# Patient Record
Sex: Female | Born: 1935
Health system: Southern US, Community
[De-identification: ages and names within clinical notes are randomized; demographics above are authoritative.]

## PROBLEM LIST (undated history)

## (undated) DIAGNOSIS — M1990 Unspecified osteoarthritis, unspecified site: Secondary | ICD-10-CM

## (undated) DIAGNOSIS — T8859XA Other complications of anesthesia, initial encounter: Secondary | ICD-10-CM

## (undated) DIAGNOSIS — M353 Polymyalgia rheumatica: Secondary | ICD-10-CM

## (undated) DIAGNOSIS — E039 Hypothyroidism, unspecified: Secondary | ICD-10-CM

## (undated) DIAGNOSIS — E7439 Other disorders of intestinal carbohydrate absorption: Secondary | ICD-10-CM

## (undated) DIAGNOSIS — E049 Nontoxic goiter, unspecified: Secondary | ICD-10-CM

## (undated) DIAGNOSIS — E119 Type 2 diabetes mellitus without complications: Secondary | ICD-10-CM

## (undated) DIAGNOSIS — F32A Depression, unspecified: Secondary | ICD-10-CM

## (undated) DIAGNOSIS — H269 Unspecified cataract: Secondary | ICD-10-CM

## (undated) DIAGNOSIS — F419 Anxiety disorder, unspecified: Secondary | ICD-10-CM

## (undated) DIAGNOSIS — I10 Essential (primary) hypertension: Secondary | ICD-10-CM

## (undated) DIAGNOSIS — Z9889 Other specified postprocedural states: Secondary | ICD-10-CM

## (undated) DIAGNOSIS — C44310 Basal cell carcinoma of skin of unspecified parts of face: Secondary | ICD-10-CM

## (undated) DIAGNOSIS — F329 Major depressive disorder, single episode, unspecified: Secondary | ICD-10-CM

## (undated) DIAGNOSIS — R112 Nausea with vomiting, unspecified: Secondary | ICD-10-CM

## (undated) DIAGNOSIS — J189 Pneumonia, unspecified organism: Secondary | ICD-10-CM

## (undated) DIAGNOSIS — N183 Chronic kidney disease, stage 3 unspecified: Secondary | ICD-10-CM

## (undated) DIAGNOSIS — M810 Age-related osteoporosis without current pathological fracture: Secondary | ICD-10-CM

## (undated) DIAGNOSIS — T4145XA Adverse effect of unspecified anesthetic, initial encounter: Secondary | ICD-10-CM

## (undated) HISTORY — DX: Other disorders of intestinal carbohydrate absorption: E74.39

## (undated) HISTORY — PX: APPENDECTOMY: SHX54

## (undated) HISTORY — DX: Unspecified osteoarthritis, unspecified site: M19.90

## (undated) HISTORY — DX: Chronic kidney disease, stage 3 unspecified: N18.30

## (undated) HISTORY — DX: Age-related osteoporosis without current pathological fracture: M81.0

## (undated) HISTORY — PX: COLONOSCOPY W/ BIOPSIES AND POLYPECTOMY: SHX1376

## (undated) HISTORY — PX: TONSILLECTOMY: SUR1361

## (undated) HISTORY — PX: ABDOMINAL HYSTERECTOMY: SHX81

---

## 2012-11-25 ENCOUNTER — Other Ambulatory Visit: Payer: Self-pay | Admitting: Sports Medicine

## 2012-11-25 DIAGNOSIS — M5410 Radiculopathy, site unspecified: Secondary | ICD-10-CM

## 2012-12-04 ENCOUNTER — Ambulatory Visit
Admission: RE | Admit: 2012-12-04 | Discharge: 2012-12-04 | Disposition: A | Discharge status: Home / Self Care | Payer: Medicare Other | Source: Ambulatory Visit | Attending: Sports Medicine | Admitting: Sports Medicine

## 2013-08-02 ENCOUNTER — Encounter (HOSPITAL_COMMUNITY): Payer: Self-pay

## 2013-08-02 ENCOUNTER — Other Ambulatory Visit (HOSPITAL_COMMUNITY): Payer: Self-pay | Admitting: Specialist

## 2013-08-04 ENCOUNTER — Encounter (HOSPITAL_COMMUNITY)
Admission: RE | Admit: 2013-08-04 | Discharge: 2013-08-04 | Disposition: A | Discharge status: Home / Self Care | Payer: Medicare Other | Source: Ambulatory Visit | Attending: Specialist | Admitting: Specialist

## 2013-08-04 ENCOUNTER — Encounter (HOSPITAL_COMMUNITY): Payer: Self-pay

## 2013-08-04 DIAGNOSIS — Z01812 Encounter for preprocedural laboratory examination: Secondary | ICD-10-CM | POA: Insufficient documentation

## 2013-08-04 HISTORY — DX: Depression, unspecified: F32.A

## 2013-08-04 HISTORY — DX: Anxiety disorder, unspecified: F41.9

## 2013-08-04 HISTORY — DX: Other specified postprocedural states: Z98.890

## 2013-08-04 HISTORY — DX: Nontoxic goiter, unspecified: E04.9

## 2013-08-04 HISTORY — DX: Type 2 diabetes mellitus without complications: E11.9

## 2013-08-04 HISTORY — DX: Major depressive disorder, single episode, unspecified: F32.9

## 2013-08-04 HISTORY — DX: Pneumonia, unspecified organism: J18.9

## 2013-08-04 HISTORY — DX: Nausea with vomiting, unspecified: R11.2

## 2013-08-04 HISTORY — DX: Adverse effect of unspecified anesthetic, initial encounter: T41.45XA

## 2013-08-04 HISTORY — DX: Other complications of anesthesia, initial encounter: T88.59XA

## 2013-08-04 HISTORY — DX: Essential (primary) hypertension: I10

## 2013-08-04 HISTORY — DX: Unspecified cataract: H26.9

## 2013-08-04 HISTORY — DX: Unspecified osteoarthritis, unspecified site: M19.90

## 2013-08-04 LAB — COMPREHENSIVE METABOLIC PANEL
ALT: 14 U/L (ref 0–35)
Alkaline Phosphatase: 59 U/L (ref 39–117)
BUN: 33 mg/dL — ABNORMAL HIGH (ref 6–23)
CO2: 27 mEq/L (ref 19–32)
Chloride: 101 mEq/L (ref 96–112)
Creatinine, Ser: 1.08 mg/dL (ref 0.50–1.10)
GFR calc Af Amer: 56 mL/min — ABNORMAL LOW (ref >90)
GFR calc non Af Amer: 48 mL/min — ABNORMAL LOW (ref >90)
Glucose, Bld: 89 mg/dL (ref 70–99)
Potassium: 4 mEq/L (ref 3.5–5.1)
Total Bilirubin: 0.2 mg/dL — ABNORMAL LOW (ref 0.3–1.2)

## 2013-08-04 LAB — CBC
HCT: 34.2 % — ABNORMAL LOW (ref 36.0–46.0)
MCV: 96.1 fL (ref 78.0–100.0)
RDW: 12.4 % (ref 11.5–15.5)
WBC: 6.7 10*3/uL (ref 4.0–10.5)

## 2013-08-04 LAB — SURGICAL PCR SCREEN
MRSA, PCR: NEGATIVE
Staphylococcus aureus: NEGATIVE

## 2013-08-04 NOTE — H&P (Addendum)
Karen Wong is an 79 y.o. female.   Chief Complaint: back and left leg pain HPI: Pt with several months of increasing left leg pain,numbness and tingling.  Conservative treatment with steroid injections and analgesics have offered no relief.  Studies indicate left L5-S1 facet cyst compressing the left L5 nerve root. And lateral rcess stenosis left L4-5. Pt wishes to proceed with resection of cyst for decompression.  Past Medical History  Diagnosis Date  . Complication of anesthesia   . PONV (postoperative nausea and vomiting)   . Hypertension   . Diabetes mellitus without complication     Borderline diabetic; no medications  . Goiter     Hx: of  . Cataracts, bilateral     Hx: of "small"  . Anxiety   . Depression   . Pneumonia   . Arthritis     Past Surgical History  Procedure Laterality Date  . Abdominal hysterectomy    . Colonoscopy w/ biopsies and polypectomy      Hx; of  . Appendectomy    . Tonsillectomy      Family History  Problem Relation Age of Onset  . Heart disease Mother   . Hypertension Mother   . Heart disease Father   . Hypertension Father   . Heart disease Brother   . Sarcoidosis Brother    Social History:  reports that she has never smoked. She has never used smokeless tobacco. She reports that she does not drink alcohol or use illicit drugs.  Allergies:  Allergies  Allergen Reactions  . Penicillins Hives  . Asa [Aspirin] Palpitations  . Demerol [Meperidine] Palpitations    Medications Prior to Admission  Medication Sig Dispense Refill  . acetaminophen (TYLENOL) 325 MG tablet Take 325 mg by mouth every 6 (six) hours as needed.       Marland Kitchen amLODipine (NORVASC) 5 MG tablet Take 5 mg by mouth daily.      Marland Kitchen b complex vitamins tablet Take 1 tablet by mouth daily.      . enalapril (VASOTEC) 10 MG tablet Take 10 mg by mouth 2 (two) times daily.      . hydrochlorothiazide (HYDRODIURIL) 25 MG tablet Take 25 mg by mouth daily.      . Levothyroxine Sodium  (TIROSINT) 25 MCG CAPS Take 25 mcg by mouth daily before breakfast.      . sertraline (ZOLOFT) 100 MG tablet Take 100 mg by mouth daily.      . traMADol (ULTRAM) 50 MG tablet Take 50 mg by mouth every 4 (four) hours as needed for moderate pain or severe pain.        Results for orders placed during the hospital encounter of 08/09/13 (from the past 48 hour(s))  GLUCOSE, CAPILLARY     Status: Abnormal   Collection Time    08/09/13  6:15 AM      Result Value Range   Glucose-Capillary 115 (*) 70 - 99 mg/dL   No results found.  Review of Systems  Constitutional: Negative.   HENT: Negative.   Eyes: Negative.   Respiratory: Negative.   Cardiovascular: Negative.   Gastrointestinal: Negative.   Genitourinary: Negative.   Musculoskeletal: Positive for back pain.       Sharp pain in the left leg increasing with sitting and walking.  Hard to get position of comfort.  Skin: Negative.   Neurological: Negative for tingling, sensory change and focal weakness.  Endo/Heme/Allergies: Negative.   Psychiatric/Behavioral: Negative.     Blood pressure 141/49, pulse  68, temperature 97.4 F (36.3 C), temperature source Oral, resp. rate 20, SpO2 97.00%. Physical Exam  Constitutional: She is oriented to person, place, and time. She appears well-developed and well-nourished.  HENT:  Head: Normocephalic and atraumatic.  Eyes: EOM are normal. Pupils are equal, round, and reactive to light.  Neck: Normal range of motion.  Cardiovascular: Normal rate, regular rhythm and normal heart sounds.   Respiratory: Effort normal and breath sounds normal.  GI: Soft.  Musculoskeletal:    She can forward bend fingertips almost to the floor.  Extension with pain.  She has to push to get out of the chair.  She is using her arms primarily.  She has weakness in left foot dorsiflexion; it is 5-/5.  Tender over the left buttock right at the sciatic notch.  Mild discomfort to palpation over the left ischial tuberosity.  Range  of motion of the hips normal.  Straight leg raise negative, both sides.  Popliteal compression significant negative, both sides.  Reflexes at the knee 2+ and symmetric; the ankle, 2+ and symmetric.  She has a slight increase in dorsal kyphosis noted.    Neurological: She is alert and oriented to person, place, and time.  Skin: Skin is warm and dry.  Psychiatric: She has a normal mood and affect.     Assessment/Plan Left L5-S1 facet cyst with stenosis, spinal stenosis Left L4-5   PLAN:  Resection of left L5-S1 cyst and decompression and decompression Left L4-5 lateral recess Patient was seen and examined in the preop holding area. There has been no interval  Change in this patient's exam preop  history and physical exam  Lab tests and images have been examined and reviewed.  The Risks benefits and alternative treatments have been discussed  extensively,questions answered.  The patient has elected to undergo the discussed surgical treatment. NITKA,JAMES E 08/09/2013, 7:25 AM

## 2013-08-04 NOTE — Pre-Procedure Instructions (Addendum)
Karen Wong  08/04/2013   Your procedure is scheduled on:  Monday, August 09, 2013  Report to [facility] Short Stay (use Main Entrance "A'') at 5:30 AM.  Call this number if you have problems the morning of surgery: [phone]   Remember:   Do not eat food or drink liquids after midnight.   Take these medicines the morning of surgery with A SIP OF WATER: Levothyroxine Sodium (TIROSINT) 25 MCG CAPS, if needed: traMADol (ULTRAM) 50 MG tablet for pain, acetaminophen (TYLENOL) 325 MG tablet for pain Stop taking Aspirin, vitamins and herbal medications. Do not take any NSAIDs ie: Ibuprofen, Advil, Naproxen or any medication containing Aspirin.  Do not wear jewelry, make-up or nail polish.  Do not wear lotions, powders, or perfumes. You may wear deodorant.  Do not shave 48 hours prior to surgery. .  Do not bring valuables to the hospital.  [facility] is not responsible for any belongings or valuables.               Contacts, dentures or bridgework may not be worn into surgery.  Leave suitcase in the car. After surgery it may be brought to your room.  For patients admitted to the hospital, discharge time is determined by your treatment team.               Patients discharged the day of surgery will not be allowed to drive home.  Name and phone number of your driver:   Special Instructions: Shower using CHG 2 nights before surgery and the night before surgery.  If you shower the day of surgery use CHG.  Use special wash - you have one bottle of CHG for all showers.  You should use approximately 1/3 of the bottle for each shower.   Please read over the following fact sheets that you were given: Pain Booklet, Coughing and Deep Breathing, MRSA Information and Surgical Site Infection Prevention

## 2013-08-05 NOTE — Progress Notes (Signed)
Anesthesia Chart Review:  Patient is a 79 year old female scheduled for left L5-S1 resection of synovial cyst, left L4-5 lateral recess decompression on 08/09/13 by Dr. Otelia Sergeant.  History includes non-smoker, HTN, borderline DM2, goiter, anxiety, depression, PNA, hysterectomy, tonsillectomy, appendectomy. PCP is listed as Dr. Feliciana Rossetti.  She was recently referred for a stress test due to abnormal EKG (see below).  EKG on 07/03/13 [facility]) showed NSR, LVH with QRS widening and repolarization abnormality, cannot rule out septal infarct, age undetermined. She has incomplete left BBB type pattern.           Nuclear stress test on 07/29/13 [facility]) showed no evidence of ischemia, diminished EF of 45% (visually 50-55%).  1V CXR on 07/03/13 showed no active disease.  Preoperative labs noted.    If no acute changes then I anticipate that she could proceed as planned.  Velna Ochs [facility] Short Stay Center/Anesthesiology Phone [phone] 08/05/2013 12:04 PM

## 2013-08-08 MED ORDER — CLINDAMYCIN PHOSPHATE 900 MG/50ML IV SOLN
900.0000 mg | INTRAVENOUS | Status: DC
  Filled 2013-08-08: qty 50

## 2013-08-09 ENCOUNTER — Encounter (HOSPITAL_COMMUNITY)
Admission: RE | Disposition: A | Discharge status: Home / Self Care | Payer: Self-pay | Source: Ambulatory Visit | Attending: Specialist

## 2013-08-09 ENCOUNTER — Observation Stay (HOSPITAL_COMMUNITY)
Admission: RE | Admit: 2013-08-09 | Discharge: 2013-08-10 | Disposition: A | Discharge status: Home / Self Care | Payer: Medicare Other | Source: Ambulatory Visit | Attending: Specialist | Admitting: Specialist

## 2013-08-09 ENCOUNTER — Ambulatory Visit (HOSPITAL_COMMUNITY): Payer: Medicare Other

## 2013-08-09 ENCOUNTER — Encounter (HOSPITAL_COMMUNITY): Payer: Self-pay

## 2013-08-09 ENCOUNTER — Encounter (HOSPITAL_COMMUNITY): Payer: Medicare Other | Admitting: Vascular Surgery

## 2013-08-09 ENCOUNTER — Ambulatory Visit (HOSPITAL_COMMUNITY): Payer: Medicare Other | Admitting: Anesthesiology

## 2013-08-09 DIAGNOSIS — F411 Generalized anxiety disorder: Secondary | ICD-10-CM | POA: Insufficient documentation

## 2013-08-09 DIAGNOSIS — H269 Unspecified cataract: Secondary | ICD-10-CM | POA: Insufficient documentation

## 2013-08-09 DIAGNOSIS — M48061 Spinal stenosis, lumbar region without neurogenic claudication: Principal | ICD-10-CM | POA: Diagnosis present

## 2013-08-09 DIAGNOSIS — M7130 Other bursal cyst, unspecified site: Secondary | ICD-10-CM | POA: Insufficient documentation

## 2013-08-09 DIAGNOSIS — I10 Essential (primary) hypertension: Secondary | ICD-10-CM | POA: Insufficient documentation

## 2013-08-09 DIAGNOSIS — E119 Type 2 diabetes mellitus without complications: Secondary | ICD-10-CM | POA: Insufficient documentation

## 2013-08-09 DIAGNOSIS — E049 Nontoxic goiter, unspecified: Secondary | ICD-10-CM | POA: Insufficient documentation

## 2013-08-09 DIAGNOSIS — F329 Major depressive disorder, single episode, unspecified: Secondary | ICD-10-CM | POA: Insufficient documentation

## 2013-08-09 DIAGNOSIS — Z886 Allergy status to analgesic agent status: Secondary | ICD-10-CM | POA: Insufficient documentation

## 2013-08-09 DIAGNOSIS — F3289 Other specified depressive episodes: Secondary | ICD-10-CM | POA: Insufficient documentation

## 2013-08-09 DIAGNOSIS — Z880 Allergy status to penicillin: Secondary | ICD-10-CM | POA: Insufficient documentation

## 2013-08-09 HISTORY — PX: LUMBAR LAMINECTOMY: SHX95

## 2013-08-09 LAB — GLUCOSE, CAPILLARY: Glucose-Capillary: 115 mg/dL — ABNORMAL HIGH (ref 70–99)

## 2013-08-09 SURGERY — MICRODISCECTOMY LUMBAR LAMINECTOMY
Anesthesia: General | Site: Spine Lumbar | Wound class: Clean

## 2013-08-09 MED ORDER — OXYCODONE-ACETAMINOPHEN 5-325 MG PO TABS: 1.0000 | ORAL_TABLET | ORAL | Status: DC | PRN

## 2013-08-09 MED ORDER — KCL IN DEXTROSE-NACL 20-5-0.45 MEQ/L-%-% IV SOLN
INTRAVENOUS | Status: DC
  Administered 2013-08-09: 12:00:00 via INTRAVENOUS
  Filled 2013-08-09 (×5): qty 1000

## 2013-08-09 MED ORDER — METHOCARBAMOL 100 MG/ML IJ SOLN
500.0000 mg | Freq: Four times a day (QID) | INTRAVENOUS | Status: DC | PRN
  Filled 2013-08-09: qty 5

## 2013-08-09 MED ORDER — HYDROMORPHONE HCL PF 1 MG/ML IJ SOLN: 0.2500 mg | INTRAMUSCULAR | Status: DC | PRN

## 2013-08-09 MED ORDER — FENTANYL CITRATE 0.05 MG/ML IJ SOLN
INTRAMUSCULAR | Status: DC | PRN
  Administered 2013-08-09 (×3): 50 ug via INTRAVENOUS
  Administered 2013-08-09: 100 ug via INTRAVENOUS

## 2013-08-09 MED ORDER — PANTOPRAZOLE SODIUM 40 MG IV SOLR
40.0000 mg | Freq: Every day | INTRAVENOUS | Status: DC
  Administered 2013-08-09: 40 mg via INTRAVENOUS
  Filled 2013-08-09 (×2): qty 40

## 2013-08-09 MED ORDER — 0.9 % SODIUM CHLORIDE (POUR BTL) OPTIME
TOPICAL | Status: DC | PRN
  Administered 2013-08-09: 1000 mL

## 2013-08-09 MED ORDER — LACTATED RINGERS IV SOLN
INTRAVENOUS | Status: DC | PRN
  Administered 2013-08-09 (×2): via INTRAVENOUS

## 2013-08-09 MED ORDER — MENTHOL 3 MG MT LOZG: 1.0000 | LOZENGE | OROMUCOSAL | Status: DC | PRN

## 2013-08-09 MED ORDER — THROMBIN 5000 UNITS EX SOLR
CUTANEOUS | Status: AC
  Filled 2013-08-09: qty 5000

## 2013-08-09 MED ORDER — LEVOTHYROXINE SODIUM 25 MCG PO CAPS: 25.0000 ug | ORAL_CAPSULE | Freq: Every day | ORAL | Status: DC

## 2013-08-09 MED ORDER — KETOROLAC TROMETHAMINE 30 MG/ML IJ SOLN
30.0000 mg | Freq: Once | INTRAMUSCULAR | Status: AC
  Administered 2013-08-09: 30 mg via INTRAVENOUS

## 2013-08-09 MED ORDER — METHOCARBAMOL 500 MG PO TABS: 500.0000 mg | ORAL_TABLET | Freq: Four times a day (QID) | ORAL | Status: DC | PRN

## 2013-08-09 MED ORDER — DOCUSATE SODIUM 100 MG PO CAPS
100.0000 mg | ORAL_CAPSULE | Freq: Two times a day (BID) | ORAL | Status: DC
  Administered 2013-08-09 – 2013-08-10 (×3): 100 mg via ORAL
  Filled 2013-08-09 (×4): qty 1

## 2013-08-09 MED ORDER — SODIUM CHLORIDE 0.9 % IV SOLN: 250.0000 mL | INTRAVENOUS | Status: DC

## 2013-08-09 MED ORDER — FLEET ENEMA 7-19 GM/118ML RE ENEM: 1.0000 | ENEMA | Freq: Once | RECTAL | Status: AC | PRN

## 2013-08-09 MED ORDER — EPHEDRINE SULFATE 50 MG/ML IJ SOLN
INTRAMUSCULAR | Status: DC | PRN
  Administered 2013-08-09: 5 mg via INTRAVENOUS

## 2013-08-09 MED ORDER — LIDOCAINE HCL (CARDIAC) 20 MG/ML IV SOLN
INTRAVENOUS | Status: DC | PRN
  Administered 2013-08-09: 60 mg via INTRAVENOUS

## 2013-08-09 MED ORDER — ONDANSETRON HCL 4 MG/2ML IJ SOLN
INTRAMUSCULAR | Status: DC | PRN
  Administered 2013-08-09: 4 mg via INTRAVENOUS

## 2013-08-09 MED ORDER — PHENYLEPHRINE HCL 10 MG/ML IJ SOLN
INTRAMUSCULAR | Status: DC | PRN
  Administered 2013-08-09 (×4): 40 ug via INTRAVENOUS

## 2013-08-09 MED ORDER — THROMBIN 5000 UNITS EX SOLR
CUTANEOUS | Status: DC | PRN
  Administered 2013-08-09 (×2): 5000 [IU] via TOPICAL

## 2013-08-09 MED ORDER — SODIUM CHLORIDE 0.9 % IJ SOLN: 3.0000 mL | Freq: Two times a day (BID) | INTRAMUSCULAR | Status: DC

## 2013-08-09 MED ORDER — OXYCODONE HCL 5 MG/5ML PO SOLN: 5.0000 mg | Freq: Once | ORAL | Status: DC | PRN

## 2013-08-09 MED ORDER — BUPIVACAINE-EPINEPHRINE (PF) 0.5% -1:200000 IJ SOLN
INTRAMUSCULAR | Status: AC
  Filled 2013-08-09: qty 10

## 2013-08-09 MED ORDER — AMLODIPINE BESYLATE 5 MG PO TABS
5.0000 mg | ORAL_TABLET | Freq: Every day | ORAL | Status: DC
Start: 2013-08-09 — End: 2013-08-10
  Administered 2013-08-10: 5 mg via ORAL
  Filled 2013-08-09 (×2): qty 1

## 2013-08-09 MED ORDER — OXYCODONE HCL 5 MG PO TABS: 5.0000 mg | ORAL_TABLET | Freq: Once | ORAL | Status: DC | PRN

## 2013-08-09 MED ORDER — BUPIVACAINE-EPINEPHRINE 0.5% -1:200000 IJ SOLN
INTRAMUSCULAR | Status: DC | PRN
Start: 2013-08-09 — End: 2013-08-09
  Administered 2013-08-09: 17 mL

## 2013-08-09 MED ORDER — ROCURONIUM BROMIDE 100 MG/10ML IV SOLN
INTRAVENOUS | Status: DC | PRN
  Administered 2013-08-09: 10 mg via INTRAVENOUS
  Administered 2013-08-09: 5 mg via INTRAVENOUS
  Administered 2013-08-09: 10 mg via INTRAVENOUS
  Administered 2013-08-09: 40 mg via INTRAVENOUS

## 2013-08-09 MED ORDER — HYDROCODONE-ACETAMINOPHEN 5-325 MG PO TABS: 1.0000 | ORAL_TABLET | ORAL | Status: DC | PRN

## 2013-08-09 MED ORDER — ACETAMINOPHEN 650 MG RE SUPP: 650.0000 mg | RECTAL | Status: DC | PRN

## 2013-08-09 MED ORDER — ONDANSETRON HCL 4 MG/2ML IJ SOLN: 4.0000 mg | Freq: Once | INTRAMUSCULAR | Status: DC | PRN

## 2013-08-09 MED ORDER — MIDAZOLAM HCL 5 MG/5ML IJ SOLN
INTRAMUSCULAR | Status: DC | PRN
  Administered 2013-08-09: 1 mg via INTRAVENOUS

## 2013-08-09 MED ORDER — MORPHINE SULFATE 2 MG/ML IJ SOLN: 1.0000 mg | INTRAMUSCULAR | Status: DC | PRN

## 2013-08-09 MED ORDER — ZOLPIDEM TARTRATE 5 MG PO TABS: 5.0000 mg | ORAL_TABLET | Freq: Every evening | ORAL | Status: DC | PRN

## 2013-08-09 MED ORDER — ENALAPRIL MALEATE 10 MG PO TABS
10.0000 mg | ORAL_TABLET | Freq: Two times a day (BID) | ORAL | Status: DC
  Administered 2013-08-10: 10 mg via ORAL
  Filled 2013-08-09 (×3): qty 1

## 2013-08-09 MED ORDER — BISACODYL 10 MG RE SUPP: 10.0000 mg | Freq: Every day | RECTAL | Status: DC | PRN

## 2013-08-09 MED ORDER — KETOROLAC TROMETHAMINE 30 MG/ML IJ SOLN
INTRAMUSCULAR | Status: AC
  Administered 2013-08-09: 30 mg via INTRAVENOUS
  Filled 2013-08-09: qty 1

## 2013-08-09 MED ORDER — PROPOFOL 10 MG/ML IV BOLUS
INTRAVENOUS | Status: DC | PRN
  Administered 2013-08-09: 100 mg via INTRAVENOUS

## 2013-08-09 MED ORDER — NEOSTIGMINE METHYLSULFATE 1 MG/ML IJ SOLN
INTRAMUSCULAR | Status: DC | PRN
  Administered 2013-08-09: 4 mg via INTRAVENOUS

## 2013-08-09 MED ORDER — GLYCOPYRROLATE 0.2 MG/ML IJ SOLN
INTRAMUSCULAR | Status: DC | PRN
  Administered 2013-08-09: 0.6 mg via INTRAVENOUS

## 2013-08-09 MED ORDER — ACETAMINOPHEN 325 MG PO TABS: 650.0000 mg | ORAL_TABLET | ORAL | Status: DC | PRN

## 2013-08-09 MED ORDER — PHENYLEPHRINE HCL 10 MG/ML IJ SOLN
10.0000 mg | INTRAVENOUS | Status: DC | PRN
  Administered 2013-08-09: 25 ug/min via INTRAVENOUS

## 2013-08-09 MED ORDER — ONDANSETRON HCL 4 MG/2ML IJ SOLN: 4.0000 mg | INTRAMUSCULAR | Status: DC | PRN

## 2013-08-09 MED ORDER — DEXAMETHASONE SODIUM PHOSPHATE 10 MG/ML IJ SOLN
INTRAMUSCULAR | Status: DC | PRN
  Administered 2013-08-09: 8 mg via INTRAVENOUS

## 2013-08-09 MED ORDER — KCL IN DEXTROSE-NACL 20-5-0.45 MEQ/L-%-% IV SOLN
INTRAVENOUS | Status: AC
  Filled 2013-08-09: qty 1000

## 2013-08-09 MED ORDER — CEFAZOLIN SODIUM 1-5 GM-% IV SOLN
1.0000 g | Freq: Three times a day (TID) | INTRAVENOUS | Status: AC
  Administered 2013-08-09 (×2): 1 g via INTRAVENOUS
  Filled 2013-08-09 (×2): qty 50

## 2013-08-09 MED ORDER — PHENOL 1.4 % MT LIQD: 1.0000 | OROMUCOSAL | Status: DC | PRN

## 2013-08-09 MED ORDER — CLINDAMYCIN PHOSPHATE 900 MG/50ML IV SOLN
INTRAVENOUS | Status: DC | PRN
  Administered 2013-08-09: 900 mg via INTRAVENOUS

## 2013-08-09 MED ORDER — CHLORHEXIDINE GLUCONATE 4 % EX LIQD: 60.0000 mL | Freq: Once | CUTANEOUS | Status: DC

## 2013-08-09 MED ORDER — TRAMADOL HCL 50 MG PO TABS
50.0000 mg | ORAL_TABLET | ORAL | Status: DC | PRN
  Administered 2013-08-09 – 2013-08-10 (×2): 50 mg via ORAL
  Filled 2013-08-09 (×2): qty 1

## 2013-08-09 MED ORDER — LEVOTHYROXINE SODIUM 25 MCG PO TABS
25.0000 ug | ORAL_TABLET | Freq: Every day | ORAL | Status: DC
  Filled 2013-08-09 (×2): qty 1

## 2013-08-09 MED ORDER — SERTRALINE HCL 100 MG PO TABS
100.0000 mg | ORAL_TABLET | Freq: Every day | ORAL | Status: DC
Start: 2013-08-09 — End: 2013-08-10
  Administered 2013-08-09: 100 mg via ORAL
  Filled 2013-08-09 (×2): qty 1

## 2013-08-09 MED ORDER — HEMOSTATIC AGENTS (NO CHARGE) OPTIME
TOPICAL | Status: DC | PRN
  Administered 2013-08-09: 1 via TOPICAL

## 2013-08-09 MED ORDER — HYDROCHLOROTHIAZIDE 25 MG PO TABS
25.0000 mg | ORAL_TABLET | Freq: Every day | ORAL | Status: DC
  Administered 2013-08-09 – 2013-08-10 (×2): 25 mg via ORAL
  Filled 2013-08-09 (×2): qty 1

## 2013-08-09 MED ORDER — SODIUM CHLORIDE 0.9 % IJ SOLN: 3.0000 mL | INTRAMUSCULAR | Status: DC | PRN

## 2013-08-09 SURGICAL SUPPLY — 59 items
BUR RND FLUTED 2.5 (BURR) IMPLANT
BUR ROUND FLUTED 4 SOFT TCH (BURR) IMPLANT
BUR SABER RD CUTTING 3.0 (BURR) ×2 IMPLANT
CANISTER SUCTION 2500CC (MISCELLANEOUS) ×2 IMPLANT
CLOTH BEACON ORANGE TIMEOUT ST (SAFETY) ×2 IMPLANT
CORDS BIPOLAR (ELECTRODE) ×2 IMPLANT
COVER MAYO STAND STRL (DRAPES) ×4 IMPLANT
COVER SURGICAL LIGHT HANDLE (MISCELLANEOUS) ×2 IMPLANT
DERMABOND ADVANCED (GAUZE/BANDAGES/DRESSINGS) ×1
DERMABOND ADVANCED .7 DNX12 (GAUZE/BANDAGES/DRESSINGS) ×1 IMPLANT
DRAPE C-ARM 42X72 X-RAY (DRAPES) ×2 IMPLANT
DRAPE MICROSCOPE LEICA (MISCELLANEOUS) ×2 IMPLANT
DRAPE POUCH INSTRU U-SHP 10X18 (DRAPES) ×2 IMPLANT
DRAPE PROXIMA HALF (DRAPES) IMPLANT
DRAPE SURG 17X23 STRL (DRAPES) ×8 IMPLANT
DRSG MEPILEX BORDER 4X4 (GAUZE/BANDAGES/DRESSINGS) ×2 IMPLANT
DRSG MEPILEX BORDER 4X8 (GAUZE/BANDAGES/DRESSINGS) IMPLANT
DURAFORM COLLAGEN 1X1 5-PACK (Neuro Prosthesis/Implant) ×2 IMPLANT
DURAPREP 26ML APPLICATOR (WOUND CARE) ×2 IMPLANT
ELECT BLADE 4.0 EZ CLEAN MEGAD (MISCELLANEOUS) ×2
ELECT CAUTERY BLADE 6.4 (BLADE) ×2 IMPLANT
ELECT REM PT RETURN 9FT ADLT (ELECTROSURGICAL) ×2
ELECTRODE BLDE 4.0 EZ CLN MEGD (MISCELLANEOUS) ×1 IMPLANT
ELECTRODE REM PT RTRN 9FT ADLT (ELECTROSURGICAL) ×1 IMPLANT
GLOVE BIO SURGEON STRL SZ7 (GLOVE) ×6 IMPLANT
GLOVE BIOGEL PI IND STRL 7.0 (GLOVE) ×1 IMPLANT
GLOVE BIOGEL PI IND STRL 7.5 (GLOVE) ×1 IMPLANT
GLOVE BIOGEL PI INDICATOR 7.0 (GLOVE) ×1
GLOVE BIOGEL PI INDICATOR 7.5 (GLOVE) ×1
GLOVE ECLIPSE 7.0 STRL STRAW (GLOVE) ×2 IMPLANT
GLOVE ECLIPSE 8.5 STRL (GLOVE) ×2 IMPLANT
GLOVE SURG 8.5 LATEX PF (GLOVE) ×2 IMPLANT
GOWN PREVENTION PLUS LG XLONG (DISPOSABLE) ×2 IMPLANT
GOWN PREVENTION PLUS XXLARGE (GOWN DISPOSABLE) ×4 IMPLANT
GOWN STRL NON-REIN LRG LVL3 (GOWN DISPOSABLE) IMPLANT
GOWN STRL REIN XL XLG (GOWN DISPOSABLE) ×2 IMPLANT
KIT BASIN OR (CUSTOM PROCEDURE TRAY) ×2 IMPLANT
KIT ROOM TURNOVER OR (KITS) ×2 IMPLANT
NEEDLE 22X1 1/2 (OR ONLY) (NEEDLE) ×2 IMPLANT
NEEDLE SPNL 18GX3.5 QUINCKE PK (NEEDLE) ×4 IMPLANT
NS IRRIG 1000ML POUR BTL (IV SOLUTION) ×2 IMPLANT
PACK LAMINECTOMY ORTHO (CUSTOM PROCEDURE TRAY) ×2 IMPLANT
PAD ARMBOARD 7.5X6 YLW CONV (MISCELLANEOUS) ×4 IMPLANT
PATTIES SURGICAL .5 X.5 (GAUZE/BANDAGES/DRESSINGS) ×2 IMPLANT
PATTIES SURGICAL .75X.75 (GAUZE/BANDAGES/DRESSINGS) ×4 IMPLANT
SPONGE LAP 4X18 X RAY DECT (DISPOSABLE) ×2 IMPLANT
SPONGE SURGIFOAM ABS GEL 100 (HEMOSTASIS) ×2 IMPLANT
SUT NURALON 4 0 TR CR/8 (SUTURE) ×2 IMPLANT
SUT VIC AB 1 CT1 27 (SUTURE) ×1
SUT VIC AB 1 CT1 27XBRD ANBCTR (SUTURE) ×1 IMPLANT
SUT VIC AB 2-0 CT1 27 (SUTURE) ×1
SUT VIC AB 2-0 CT1 TAPERPNT 27 (SUTURE) ×1 IMPLANT
SUT VICRYL 0 UR6 27IN ABS (SUTURE) ×2 IMPLANT
SUT VICRYL 4-0 PS2 18IN ABS (SUTURE) ×2 IMPLANT
SYR CONTROL 10ML LL (SYRINGE) ×2 IMPLANT
TOWEL OR 17X24 6PK STRL BLUE (TOWEL DISPOSABLE) ×2 IMPLANT
TOWEL OR 17X26 10 PK STRL BLUE (TOWEL DISPOSABLE) ×2 IMPLANT
TRAY FOLEY CATH 16FRSI W/METER (SET/KITS/TRAYS/PACK) ×2 IMPLANT
WATER STERILE IRR 1000ML POUR (IV SOLUTION) ×2 IMPLANT

## 2013-08-09 NOTE — Anesthesia Postprocedure Evaluation (Signed)
  Anesthesia Post-op Note  Patient: Karen Wong  Procedure(s) Performed: Procedure(s): Left L5-S1 resection of synovial cyst, decompression left L4-5 lateral recess (N/A)  Patient Location: PACU  Anesthesia Type:General  Level of Consciousness: awake, alert  and oriented  Airway and Oxygen Therapy: Patient Spontanous Breathing and Patient connected to nasal cannula oxygen  Post-op Pain: mild  Post-op Assessment: Post-op Vital signs reviewed, Patient's Cardiovascular Status Stable, Respiratory Function Stable, Patent Airway and Pain level controlled  Post-op Vital Signs: stable  Complications: No apparent anesthesia complications

## 2013-08-09 NOTE — Transfer of Care (Signed)
Immediate Anesthesia Transfer of Care Note  Patient: Karen Wong  Procedure(s) Performed: Procedure(s): Left L5-S1 resection of synovial cyst, decompression left L4-5 lateral recess (N/A)  Patient Location: PACU  Anesthesia Type:General  Level of Consciousness: awake, alert  and oriented  Airway & Oxygen Therapy: Patient connected to face mask  Post-op Assessment: Report given to PACU RN  Post vital signs: stable  Complications: No apparent anesthesia complications

## 2013-08-09 NOTE — Brief Op Note (Signed)
08/09/2013  11:36 AM  PATIENT:  Karen Wong  79 y.o. female  PRE-OPERATIVE DIAGNOSIS:  Left L5-S1 synovial cyst, spinal stenosis L4-5  POST-OPERATIVE DIAGNOSIS:  Left L5-S1 synovial cyst, spinal stenosis L4-5  PROCEDURE:  Procedure(s): Left L5-S1 resection of synovial cyst, decompression left L4-5 lateral recess (N/A)  SURGEON:  Surgeon(s) and Role:    * Kerrin Champagne, MD - Primary  PHYSICIAN ASSISTANT: Maud Deed PAC  ASSISTANTS: none   ANESTHESIA:   regional  EBL:  Total I/O In: 1700 [I.V.:1700] Out: 600 [Urine:400; Blood:200]  BLOOD ADMINISTERED:none  DRAINS: none   LOCAL MEDICATIONS USED:  MARCAINE     SPECIMEN:  No Specimen  DISPOSITION OF SPECIMEN:  N/A  COUNTS:  YES  TOURNIQUET:  * No tourniquets in log *  DICTATION: .Note written in EPIC  PLAN OF CARE: Admit for overnight observation  PATIENT DISPOSITION:  PACU - hemodynamically stable.   Delay start of Pharmacological VTE agent (>24hrs) due to surgical blood loss or risk of bleeding: yes

## 2013-08-09 NOTE — Anesthesia Preprocedure Evaluation (Addendum)
Anesthesia Evaluation  Patient identified by MRN, date of birth, ID band Patient awake    Reviewed: Allergy & Precautions, H&P , NPO status , Patient's Chart, lab work & pertinent test results, reviewed documented beta blocker date and time   History of Anesthesia Complications (+) PONV  Airway Mallampati: I TM Distance: >3 FB Neck ROM: Full    Dental  (+) Teeth Intact and Dental Advisory Given   Pulmonary pneumonia -, resolved,  breath sounds clear to auscultation        Cardiovascular hypertension, Pt. on medications Rhythm:Regular     Neuro/Psych Anxiety Depression    GI/Hepatic negative GI ROS, Neg liver ROS,   Endo/Other  diabetes, Well Controlled, Type 2  Renal/GU negative Renal ROS     Musculoskeletal negative musculoskeletal ROS (+)   Abdominal (+)  Abdomen: soft. Bowel sounds: normal.  Peds  Hematology negative hematology ROS (+)   Anesthesia Other Findings   Reproductive/Obstetrics negative OB ROS                        Anesthesia Physical Anesthesia Plan  ASA: III  Anesthesia Plan: General   Post-op Pain Management:    Induction: Intravenous  Airway Management Planned: Oral ETT  Additional Equipment:   Intra-op Plan:   Post-operative Plan: Extubation in OR  Informed Consent: I have reviewed the patients History and Physical, chart, labs and discussed the procedure including the risks, benefits and alternatives for the proposed anesthesia with the patient or authorized representative who has indicated his/her understanding and acceptance.   Dental advisory given  Plan Discussed with: CRNA and Anesthesiologist  Anesthesia Plan Comments: (Synovial cyst L5-S1 Type 2 DM glucose 115 Htn Anxiety H/O post-op N/V  Plan GA with oral ETT  Kipp Brood, MD)        Anesthesia Quick Evaluation

## 2013-08-09 NOTE — Op Note (Signed)
08/09/2013  11:56 AM  PATIENT:  Karen Wong  79 y.o. female  MRN: [id number]  OPERATIVE REPORT  PRE-OPERATIVE DIAGNOSIS:  Left L5-S1 synovial cyst, spinal stenosis L4-5  POST-OPERATIVE DIAGNOSIS:  Left L5-S1 synovial cyst, spinal stenosis L4-5,mild lateral recess stenosis L3-4  PROCEDURE:  Procedure(s): Left L5-S1 resection of synovial cyst, decompression left L4-5 lateral recess, Lateral recess decompression left L3-4. MIS with OR microscope.    SURGEON:  Kerrin Champagne, MD     ASSISTANT:  Maud Deed, PA-C  (Present throughout the entire procedure and necessary for completion of procedure in a timely manner)     ANESTHESIA:  General, Dr.Joslin, supplemented with local anesthesia  20 cc Marcaine 1/2% with 1/200,000 epi.    COMPLICATIONS:  Additional level surgery L3-4 lateral recess decompressed, left L4-5 pin point dural leak repaired with one 4-0 neurolon suture and duragen.  DRAINS: Foley to SD.  EBL 200cc      PROCEDURE:The patient was met in the holding area, and the appropriate left Lumbar level L5-S1 and L4-5 identified and marked with "x" and my initials.The patient was then transported to OR and was placed under general anesthesia without difficulty. The patient received appropriate preoperative antibiotic prophylaxis. Foley catheter was placed sterilely. The patient after intubation atraumatically was transferred to the operating room table, prone position, Wilson frame, sliding OR table. All pressure points were well padded. The arms in 90-90 well-padded at the elbows. Standard prep with DuraPrep solution lower dorsal spine to the mid sacral segment. Draped in the usual manner iodine Vi-Drape was used. Time-out procedure was called and correct. 2x 18-gauge spinal needle was then inserted at the expected L4 level. C-arm was draped sterilely to the field and used to identify the spinal needles positions. The needle was at the lower aspect of the lamina of L4 Skin superior  to this was then infiltrated with Marcaine half percent with 1-200,000 epinephrine total of 15 cc used. An incision approximately an inch inch and a half in length was then made through skin and subcutaneous layers in line with the right side of the expected midline just superior to the spinal needle entry point. An incision made into the left lumbosacral fascia approximately an inch in length .  Smallest dilator was then introduced into the incision site and used to carefully perform subperiosteal movement of  paralumbar muscles off of the posterior lamina of the expected L5-S1 level. Successive dilators were then carried up to the 11 mm size. The depth measured off of the dilators at about 60 mm and 60 mm retractors and placed on the scaffolding for the MIS equipment and guided over dilators down to and docking on the posterior aspect of the lamina at the expected L5-S1 level. This was sterilely attached to the articulating arm and it's up right which had been attached the OR table sterilely. C-arm fluoroscopy identified the dilators and the retractors at the appropriate level L5-S1 A localization lateral C-arm view was obtained with Penfield 4 in the L5-S1 facet.. The operating room microscope sterilely draped brought into the field. Under the operating room microscope, the presumed L5-S1 interspace carefully debrided the small amount of muscle attachment here and high-speed bur used to drill the medial aspect of the inferior articular process of L5 approximately 10%.  2 mm Kerrison then used to enter the spinal canal over the superior aspect of the L5 lamina carefully using the Kerrison to debris the attachment as a curet. Foraminotomy was then performed over the L5  nerve root. The medial 10% superior articular process of S1 and then resected using an osteotome and 2 mm Kerrison. This allowed for identification of the thecal sac. Penfield 4 was then used to carefully mobilize the thecal sac medially and the S1  nerve root identified within the lateral recess. Carefully the lateral aspect of the S1 nerve root was identified and a Penfield 4 was used to mobilize the nerve. Further foraminotomies was performed over the L5 nerve root the nerve root was noted to be decompressed. The nerve root able to be retracted along the medial aspect of the S1 pedicle .Ligamentum flavum was further debrided superiorly to the level L5-S1 disc. Had a moderate amount of further resection of the L5 lamina inferiorly was performed. Ligamentum flavum was debrided from lateral recess along the medial aspect L5-S1 facet no further decompression was necessary. A small flash of clear fluid noted over the central laminotomy area. Careful inspection of the laminotomy at this level unable to show a dural rent or tear. Attention was then turned to performing the next level lateral recess decompression. Ball tip nerve probe was then able to carefully palpate the neuroforamen for L5 and S1 finding these to be well decompressed. Attention then turned to the expected L4-5 level which was easily visualized with the microscope. Soft tissues debrided about the posterior aspect of the L4-5 interspace. High-speed bur and then used to carefully drill inferior 3 or 4 mm of the leftt side L4 lamina and on the medial aspect of the right L4 inferior articular process of 3 mm. The superior margin of the L5 lamina then carefully debrided with curette and a 2 mm kerrison then used to resect bone inferior aspect of L4 freeing up the attachment of ligamentum flavum here. Ligamentum flavum then debrided with the 2 mm and 3 mm Kerrisons we decompressed the L4 nerve root and the lateral recess leftt L4-5 decompressed using 2 and 3 mm Kerrisons to resect the hypertrophic reflected ligamentum flavum extending superiorly. Flavum was resected off the ventral aspect of the inferior margin of the L3 lamina. Hockey-stick nerve probe could then be passed out the L4 neuroforamen and  the L5 neuroforamen. Venous bleeding encountered. Thrombin-soaked Gelfoam used to control this following this then the sac and the L5 nerve root were mobilized medially and the L4-5 disc examined and found not to be herniated. Irrigation was carried out down to this bleeding controlled with Gelfoam. Gelfoam was then removed. Irrigation carried careful examination demonstrated no active bleeding present. A file localization radiograph was obtained with I nerve hook at the expected left L5 neuroforamen and the C-arm views demonstrated the nerve hook at the L4-5 neuroforamen one level above the expected lower level. With this then the retractors of the MIS system were then carefully and replace inferiorly and in the next lower interspace identified and C-arm fluoroscopy used to identify a Penfield 4 within the L5-S1 facet. The left L5-S1 posterior interlaminar space was then carefully debrided soft tissue about its edges high-speed bur was then used to remove about 3 mm off the inferior aspect of the left L5 lamina and to resect a flexed approximately 2 mm off the medial aspect of the left L5 inferior tip of process. The inferior lamina of L5 was resected upwards until the insertion site of the ligamentum flavum was identified and able to be debrided off the ventral surface of the lamina. Ligamentum flavum was then carefully retracted using I nerve hook and 2 and 3 mm  Kerrison was used to resect hypertrophic ligamentum flavum. A curet was used to debride the superficial portions of ligamentum flavum off the superior aspect of the S1 lamina. The superficial ligamentum flavum was then removed by grasping this material with a pituitary and removing it appropriately. The remaining small amount of ligamentum flavum and the underlying synovial cyst on the left side at L5-S1 was debrided off the medial aspect of the L5-S1 facet using 3 mm Kerrisons and debriding this synovial cyst which was found to be friable and consistent  with synovial material and instilled steroid material. A left L5-S1 lateral recess was unable to be carefully decompressed the S1 nerve root identified developed a cyst exiting the S1 neuroforamen without difficulty. Hockey-stick nerve probe could be passed out the S1 neuroforamen and out the L5 neuroforamen indicating that these areas have been well decompressed. Small amounts of reflected ligamentum flavum her found still residual the medial aspect of the L5-S1 facet these were further debrided using Kerrisons until no further thrombus present. Bleeders were controlled using bipolar electrocautery thrombin soaked Gelfoam bone wax applied to bleeding cancellus bone surfaces of the laminotomy left side L5-S1. Bone bleeding was completely controlled then some Gelfoam and cottonoids were left in the laminotomy area and the lumbar spine really and visualized with C-arm with I nerve hook at the L4-5 level and a Penfield 4 at the L5-S1 level. These identified for this surgery. MIS laminotomy area and with the microscope careful examination of the laminotomy site demonstrated hypertrophic flava from the right side that was present crossing the midline towards the left thecal sac was carefully depressed and this ligamentum flavum resected using 2 and 3 mm Kerrison off the right side of the canal decompressing the right side of the canal from the left side. Epidural fat local localized over the posterior aspect of the thecal sac was then carefully maneuvered using nerve hook and Penfield 4 small less than a millimeter pinpoint opening of the dura was found to be present. The patient's blood pressure was elevated to 130 systolic there was plenty of CSF fluid leaking from this small opening however when blood pressure was decreased to 90mm Hg systolic the amount of CSF leak was negligible or not present. A single 4-0 Nurolon stitch was placed side to side closing this pinpoint opening. Patient then Valsalva to 20 mm mercury  demonstrated no leakage of CSF. A small portion of duragen applied over the area of a single suture repair. Irrigation was then carried out all Gelfoam was then removed. No active bleeding was present. MIS retraction equipment was then returned to the left side L4-5 Carefully the left side posterior Retractors were then carefully removed Since carefully then the Bleeding was then controlled using thrombin-soaked Gelfoam small cottonoids. Small amount of bleeding within the soft tissue mass the laminotomy area was controlled using bipolar electrocautery. Irrigation was carried out using copious amounts of irrigant solution. All Gelfoam were then removed. No significant active bleeding present at the time of removal. All instruments sponge counts were correct traction system was then carefully removed carefully rotating retractors with this withdrawal and only bipolar electrocautery of any small bleeders. Lumbodorsal fascia was then carefully approximated with interrupted 0 Vicryl sutures, UR 6 needle deep subcutaneous layers were approximated with interrupted 0 Vicryl sutures on UR 6 needle the more subcutaneous layers approximated with interrupted 2-0 Vicryl sutures and the skin closed with a running subcutaneous stitch of 4-0 Vicryl. Dermabond was applied allowed to dry and then  Mepilex bandage applied. Patient was then carefully returned to supine position on a stretcher, reactivated and extubated. He was then returned to recovery room in satisfactory condition.  Maud Deed PA-C perform the duties of assistant surgeon during this case. She was present from the beginning of the case to the end of the case assisting in transfer the patient from his stretcher to the OR table and back to the stretcher at the end of the case. Assisted in careful retraction and suction of the laminectomy site delicate neural structures operating under the operating room microscope. She performed closure of the incision from the  fascia to the skin applying the dressing.     Rainen Vanrossum E  08/09/2013, 11:56 AM

## 2013-08-09 NOTE — Preoperative (Signed)
Beta Blockers   Reason not to administer Beta Blockers:Not Applicable 

## 2013-08-09 NOTE — Anesthesia Procedure Notes (Addendum)
Procedure Name: Intubation Date/Time: 08/09/2013 7:45 AM Performed by: Ellin Goodie Pre-anesthesia Checklist: Patient identified, Emergency Drugs available, Suction available, Patient being monitored and Timeout performed Patient Re-evaluated:Patient Re-evaluated prior to inductionOxygen Delivery Method: Circle system utilized Preoxygenation: Pre-oxygenation with 100% oxygen Intubation Type: IV induction Ventilation: Mask ventilation without difficulty and Oral airway inserted - appropriate to patient size Laryngoscope Size: Mac and 3 Grade View: Grade II Tube type: Oral Tube size: 7.5 mm Number of attempts: 3 Airway Equipment and Method: Stylet and Video-laryngoscopy Placement Confirmation: ETT inserted through vocal cords under direct vision,  positive ETCO2 and breath sounds checked- equal and bilateral Secured at: 21 cm Tube secured with: Tape Dental Injury: Teeth and Oropharynx as per pre-operative assessment  Difficulty Due To: Difficult Airway- due to limited oral opening, Difficult Airway- due to anterior larynx and Difficulty was unanticipated Future Recommendations: Recommend- induction with short-acting agent, and alternative techniques readily available Comments: Paramedic student attempted DL x 1 with Miller 2 blade with assistance with poor view.  Dr. Noreene Larsson also had poor view with [facility] 2 blade.  DL x 1 with MAC 3  blade by myself with view of epiglottis only.  Video laryngoscope utilized x 1 with MAC 3 blade and ETT passed easily through cords.  Dr. Noreene Larsson verified placement of ETT.  Carlynn Herald, CRNA

## 2013-08-10 MED ORDER — TRAMADOL HCL 50 MG PO TABS: 50.0000 mg | ORAL_TABLET | ORAL | Status: DC | PRN

## 2013-08-10 NOTE — Progress Notes (Signed)
Patient ID: Karen Wong, female   DOB: 1934/05/10, 79 y.o.   MRN: [id number] Subjective: 1 Day Post-Op Procedure(s) (LRB): Left L5-S1 resection of synovial cyst, decompression left L4-5 lateral recess (N/A) Awake alert and oriented x 3. No Head aches, no nausea, no vomitting . Positive flatus  Tolerating pos. Patient reports pain as mild.    Objective:   VITALS:  Temp:  [97 F (36.1 C)-98.2 F (36.8 C)] 98.2 F (36.8 C) (11/18 0559) Pulse Rate:  [57-88] 77 (11/18 0559) Resp:  [11-20] 16 (11/18 0559) BP: (96-132)/(33-59) 111/44 mmHg (11/18 0559) SpO2:  [97 %-100 %] 100 % (11/18 0559) Weight:  [62.596 kg (138 lb)] 62.596 kg (138 lb) (11/17 1300)  Neurologically intact ABD soft Neurovascular intact Intact pulses distally Dorsiflexion/Plantar flexion intact Incision: no drainage and no fluctuance. No cellulitis present   LABS No results found for this basename: HGB, WBC, PLT,  in the last 72 hours No results found for this basename: NA, K, CL, CO2, BUN, CREATININE, GLUCOSE,  in the last 72 hours No results found for this basename: LABPT, INR,  in the last 72 hours   Assessment/Plan: 1 Day Post-Op Procedure(s) (LRB): Left L5-S1 resection of synovial cyst, decompression left L4-5 lateral recess (N/A)  Advance diet Up with therapy Discharge home with home health  Lorren Splawn E 08/10/2013, 8:30 AM

## 2013-08-10 NOTE — Progress Notes (Signed)
Nutrition Brief Note  Patient identified on the Malnutrition Screening Tool (MST) Report  Pt reports that she may have lost some weight due to pain over the last year. Per pt usual weight is 138 lb, which is current weight. Pt states that she had been unable to cook so she had bought all of her food from the cafeteria.  Daughter at bedside and reports that pt had stocked up with food easy to prepare in anticipation of this surgery. Per pt her appetite is better since the pain is decreased.  Reviewed liquid supplement options with pt and daughter in the case of her appetite decreasing again. Pt feels that she could drink [employer] if needed.  Pt declines supplements at this time.    Wt Readings from Last 15 Encounters:  08/09/13 138 lb (62.596 kg)  08/09/13 138 lb (62.596 kg)  08/04/13 138 lb 6.4 oz (62.778 kg)    Body mass index is 24.45 kg/(m^2). BMI WNL.   Current diet order is Regular, patient is consuming approximately 50% of meals at this time. Labs and medications reviewed.   No nutrition interventions warranted at this time. If nutrition issues arise, please consult RD.   Kendell Bane RD, LDN, CNSC [phone] Pager [phone] After Hours Pager

## 2013-08-10 NOTE — Progress Notes (Signed)
Patient discharged to home. D/c instructions given, no questions verbalized. Vitals stable.

## 2013-08-11 ENCOUNTER — Encounter (HOSPITAL_COMMUNITY): Payer: Self-pay | Admitting: Specialist

## 2013-08-27 NOTE — Brief Op Note (Signed)
Brief Op note reviewed .

## 2013-08-30 NOTE — Discharge Summary (Signed)
Physician Discharge Summary  Patient ID: Karen Wong MRN: [id number] DOB/AGE: Jun 23, 1934 79 y.o.  Admit date: 08/09/2013 Discharge date: 08/10/2013  Admission Diagnoses:  Spinal stenosis of lumbar region L4-5 Left L5-S1 synovial cyst  Mild lateral recess stenosis Left L3-4  Discharge Diagnoses:  Principal Problem:   Spinal stenosis of lumbar region same as above  Past Medical History  Diagnosis Date  . Complication of anesthesia   . PONV (postoperative nausea and vomiting)   . Hypertension   . Diabetes mellitus without complication     Borderline diabetic; no medications  . Goiter     Hx: of  . Cataracts, bilateral     Hx: of "small"  . Anxiety   . Depression   . Pneumonia   . Arthritis     Surgeries: Procedure(s): Left L5-S1 resection of synovial cyst, decompression left L4-5 and Left L3-4 lateral recess on 08/09/2013   Consultants (if any):  none  Discharged Condition: Improved  Hospital Course: Karen Wong is an 79 y.o. female who was admitted 08/09/2013 with a diagnosis of Spinal stenosis of lumbar region and went to the operating room on 08/09/2013 and underwent the above named procedures.    She was given perioperative antibiotics:  Anti-infectives   Start     Dose/Rate Route Frequency Ordered Stop   08/09/13 1500  ceFAZolin (ANCEF) IVPB 1 g/50 mL premix     1 g 100 mL/hr over 30 Minutes Intravenous Every 8 hours 08/09/13 1327 08/09/13 2307   08/08/13 1109  clindamycin (CLEOCIN) IVPB 900 mg  Status:  Discontinued     900 mg 100 mL/hr over 30 Minutes Intravenous On call to O.R. 08/08/13 1109 08/09/13 1327    .  She was given sequential compression devices, early ambulation for DVT prophylaxis.  She benefited maximally from the hospital stay and there were no complications.    Recent vital signs:  Filed Vitals:   08/10/13 0943  BP: 105/54  Pulse:   Temp:   Resp:     Recent laboratory studies:  Lab Results  Component Value Date   HGB  11.9* 08/04/2013   Lab Results  Component Value Date   WBC 6.7 08/04/2013   PLT 226 08/04/2013   No results found for this basename: INR   Lab Results  Component Value Date   NA 139 08/04/2013   K 4.0 08/04/2013   CL 101 08/04/2013   CO2 27 08/04/2013   BUN 33* 08/04/2013   CREATININE 1.08 08/04/2013   GLUCOSE 89 08/04/2013    Discharge Medications:     Medication List         acetaminophen 325 MG tablet  Commonly known as:  TYLENOL  Take 325 mg by mouth every 6 (six) hours as needed.     amLODipine 5 MG tablet  Commonly known as:  NORVASC  Take 5 mg by mouth daily.     b complex vitamins tablet  Take 1 tablet by mouth daily.     enalapril 10 MG tablet  Commonly known as:  VASOTEC  Take 10 mg by mouth 2 (two) times daily.     hydrochlorothiazide 25 MG tablet  Commonly known as:  HYDRODIURIL  Take 25 mg by mouth daily.     oxyCODONE-acetaminophen 5-325 MG per tablet  Commonly known as:  ROXICET  Take 1-2 tablets by mouth every 4 (four) hours as needed for severe pain.     sertraline 100 MG tablet  Commonly known as:  ZOLOFT  Take  100 mg by mouth daily.     TIROSINT 25 MCG Caps  Generic drug:  Levothyroxine Sodium  Take 25 mcg by mouth daily before breakfast.     traMADol 50 MG tablet  Commonly known as:  ULTRAM  Take 1 tablet (50 mg total) by mouth every 4 (four) hours as needed for moderate pain or severe pain.     traMADol 50 MG tablet  Commonly known as:  ULTRAM  Take 50 mg by mouth every 4 (four) hours as needed for moderate pain or severe pain.        Diagnostic Studies: Dg Lumbar Spine 1 View  September 03, 2013   CLINICAL DATA:  Intraoperative localization for spine surgery.  EXAM: LUMBAR SPINE - 1 VIEW; DG C-ARM 1-60 MIN  COMPARISON:  MRI lumbar spine 12/04/2012.  FINDINGS: Intraoperative spot film demonstrates surgical instruments marking the L4-5 and L5-S1 levels.  IMPRESSION: L4-5 and L5-S1 marked intraoperatively.   Electronically Signed   By:  Loralie Champagne M.D.   On: 2013/09/03 13:38   Dg C-arm 1-60 Min  2013-09-03   CLINICAL DATA:  Intraoperative localization for spine surgery.  EXAM: LUMBAR SPINE - 1 VIEW; DG C-ARM 1-60 MIN  COMPARISON:  MRI lumbar spine 12/04/2012.  FINDINGS: Intraoperative spot film demonstrates surgical instruments marking the L4-5 and L5-S1 levels.  IMPRESSION: L4-5 and L5-S1 marked intraoperatively.   Electronically Signed   By: Loralie Champagne M.D.   On: 09-03-2013 13:38    Disposition: 01-Home or Self Care      Discharge Orders   Future Orders Complete By Expires   Call MD / Call 911  As directed    Comments:     If you experience chest pain or shortness of breath, CALL 911 and be transported to the hospital emergency room.  If you develope a fever above 101 F, pus (white drainage) or increased drainage or redness at the wound, or calf pain, call your surgeon's office.   Constipation Prevention  As directed    Comments:     Drink plenty of fluids.  Prune juice may be helpful.  You may use a stool softener, such as Colace (over the counter) 100 mg twice a day.  Use MiraLax (over the counter) for constipation as needed.   Diet - low sodium heart healthy  As directed    Discharge instructions  As directed    Comments:     No lifting greater than 5 lbs. Avoid bending, stooping and twisting. Walk in house for first week them may start to get out slowly increasing distance up to one mile by 3 weeks post op. Keep incision dry for 3 days, may use tegaderm or similar water impervious dressing.   Driving restrictions  As directed    Comments:     No driving for  2  weeks   Increase activity slowly as tolerated  As directed    Lifting restrictions  As directed    Comments:     No lifting for 4 weeks      Follow-up Information   Follow up with Marifer Hurd E, MD. Schedule an appointment as soon as possible for a visit in 2 weeks.   Specialty:  Orthopedic Surgery   Contact information:   [address]  [place] [address] [phone]        Signed: Wende Neighbors 08/30/2013, 9:14 AM

## 2013-08-30 NOTE — H&P (Signed)
This note was reviewed and signed on 08/09/2013. I am resigning again as requested.

## 2013-09-23 DIAGNOSIS — M353 Polymyalgia rheumatica: Secondary | ICD-10-CM

## 2013-09-23 HISTORY — DX: Polymyalgia rheumatica: M35.3

## 2015-06-08 ENCOUNTER — Other Ambulatory Visit: Payer: Self-pay | Admitting: Otolaryngology

## 2015-06-08 ENCOUNTER — Encounter (HOSPITAL_BASED_OUTPATIENT_CLINIC_OR_DEPARTMENT_OTHER): Payer: Self-pay | Admitting: *Deleted

## 2015-06-08 NOTE — H&P (Signed)
PREOPERATIVE H&P  Chief Complaint: recurrent left submandibular gland infections  HPI: Karen Wong is a 81 y.o. female who presents for evaluation of a several year history of left submandibular gland infections. She's taken frequent antibiotics that help temporarily but keeps getting recurrent infections. Recent CT scan demonstrated multiple stones with the gland and duct. She's taken to the OR for excision of the gland.   Past Medical History  Diagnosis Date  . Complication of anesthesia   . Hypertension   . Diabetes mellitus without complication     Borderline diabetic; no medications  . Goiter     Hx: of  . Cataracts, bilateral     Hx: of "small"  . Anxiety   . Depression   . Pneumonia   . Arthritis   . Polymyalgia January 2015    shoulders, elbows, wrists, hands  . Hypothyroidism   . PONV (postoperative nausea and vomiting)     "Not in a long time"  . Basal cell carcinoma of face    Past Surgical History  Procedure Laterality Date  . Abdominal hysterectomy    . Colonoscopy w/ biopsies and polypectomy      Hx; of  . Appendectomy    . Tonsillectomy    . Lumbar laminectomy N/A 08/09/2013    Procedure: Left L5-S1 resection of synovial cyst, decompression left L4-5 lateral recess;  Surgeon: Jessy Oto, MD;  Location: [facility];  Service: Orthopedics;  Laterality: N/A;   Social History   Social History  . Marital Status: Married    Spouse Name: N/A  . Number of Children: N/A  . Years of Education: N/A   Social History Main Topics  . Smoking status: Never Smoker   . Smokeless tobacco: Never Used  . Alcohol Use: No  . Drug Use: No  . Sexual Activity: Not Asked   Other Topics Concern  . None   Social History Narrative   Family History  Problem Relation Age of Onset  . Heart disease Mother   . Hypertension Mother   . Heart disease Father   . Hypertension Father   . Heart disease Brother   . Sarcoidosis Brother    Allergies  Allergen Reactions  .  Penicillins Hives  . Prednisone Other (See Comments)    Gets very "jumpy" and "mean"  . Asa [Aspirin] Palpitations  . Demerol [Meperidine] Palpitations   Prior to Admission medications   Medication Sig Start Date End Date Taking? Authorizing Provider  acetaminophen (TYLENOL) 325 MG tablet Take 325 mg by mouth every 6 (six) hours as needed.    Yes Historical Provider, MD  amLODipine (NORVASC) 5 MG tablet Take 5 mg by mouth daily.   Yes Historical Provider, MD  Camphor (JOINTFLEX) 3.1 % CREA Apply topically.   Yes Historical Provider, MD  enalapril (VASOTEC) 10 MG tablet Take 10 mg by mouth 2 (two) times daily.   Yes Historical Provider, MD  hydrochlorothiazide (HYDRODIURIL) 25 MG tablet Take 25 mg by mouth daily.   Yes Historical Provider, MD  Levothyroxine Sodium (TIROSINT) 25 MCG CAPS Take 25 mcg by mouth daily before breakfast.   Yes Historical Provider, MD  sertraline (ZOLOFT) 100 MG tablet Take 100 mg by mouth daily.   Yes Historical Provider, MD  oxyCODONE-acetaminophen (ROXICET) 5-325 MG per tablet Take 1-2 tablets by mouth every 4 (four) hours as needed for severe pain. 08/09/13   Phillips Hay, PA-C  traMADol (ULTRAM) 50 MG tablet Take 50 mg by mouth every 4 (four)  hours as needed for moderate pain or severe pain.    Historical Provider, MD  traMADol (ULTRAM) 50 MG tablet Take 1 tablet (50 mg total) by mouth every 4 (four) hours as needed for moderate pain or severe pain. 08/10/13   Jessy Oto, MD     Positive ROS: per HPI  All other systems have been reviewed and were otherwise negative with the exception of those mentioned in the HPI and as above.  Physical Exam: There were no vitals filed for this visit.  General: Alert, no acute distress Oral: Normal oral mucosa and tonsils. Palpable stone in distal duct. Nasal: Clear nasal passages Neck: No palpable adenopathy or thyroid nodules. Enlarged left submandibular gland. Ear: Ear canal is clear with normal appearing  TMs Cardiovascular: Regular rate and rhythm, no murmur.  Respiratory: Clear to auscultation Neurologic: Alert and oriented x 3   Assessment/Plan: [place] for Procedure(s): EXCISION SUBMANDIBULAR GLAND   Melony Overly, MD 06/08/2015 5:09 PM

## 2015-06-08 NOTE — Pre-Procedure Instructions (Signed)
Will need EKG DOS.  Patient is on diuretic plus other hypertensive agents. Patent states she had blood work done 05/22/15 and will bring copy of results with her DOS. Potassium 4.4 according to patient from that 05/22/15 report . She will need I-stat if anesthesia does not accept these labs.

## 2015-06-12 NOTE — Interval H&P Note (Signed)
History and Physical Interval Note:  06/12/2015 5:13 PM  Karen Wong  has presented today for surgery, with the diagnosis of SWOLLEN SUBMANDIBULAR GLAND  The various methods of treatment have been discussed with the patient and family. After consideration of risks, benefits and other options for treatment, the patient has consented to  Procedure(s): EXCISION SUBMANDIBULAR GLAND (N/A) as a surgical intervention .  The patient's history has been reviewed, patient examined, no change in status, stable for surgery.  I have reviewed the patient's chart and labs.  Questions were answered to the patient's satisfaction.     NEWMAN, CHRISTOPHER

## 2015-06-13 ENCOUNTER — Ambulatory Visit (HOSPITAL_BASED_OUTPATIENT_CLINIC_OR_DEPARTMENT_OTHER): Payer: PPO | Admitting: Anesthesiology

## 2015-06-13 ENCOUNTER — Ambulatory Visit (HOSPITAL_BASED_OUTPATIENT_CLINIC_OR_DEPARTMENT_OTHER)
Admission: RE | Admit: 2015-06-13 | Discharge: 2015-06-13 | Disposition: A | Discharge status: Home / Self Care | Payer: PPO | Source: Ambulatory Visit | Attending: Otolaryngology | Admitting: Otolaryngology

## 2015-06-13 ENCOUNTER — Encounter (HOSPITAL_BASED_OUTPATIENT_CLINIC_OR_DEPARTMENT_OTHER)
Admission: RE | Disposition: A | Discharge status: Home / Self Care | Payer: Self-pay | Source: Ambulatory Visit | Attending: Otolaryngology

## 2015-06-13 ENCOUNTER — Encounter (HOSPITAL_BASED_OUTPATIENT_CLINIC_OR_DEPARTMENT_OTHER): Payer: Self-pay | Admitting: Anesthesiology

## 2015-06-13 DIAGNOSIS — F419 Anxiety disorder, unspecified: Secondary | ICD-10-CM | POA: Insufficient documentation

## 2015-06-13 DIAGNOSIS — M1990 Unspecified osteoarthritis, unspecified site: Secondary | ICD-10-CM | POA: Insufficient documentation

## 2015-06-13 DIAGNOSIS — E039 Hypothyroidism, unspecified: Secondary | ICD-10-CM | POA: Diagnosis not present

## 2015-06-13 DIAGNOSIS — K115 Sialolithiasis: Secondary | ICD-10-CM | POA: Insufficient documentation

## 2015-06-13 DIAGNOSIS — I10 Essential (primary) hypertension: Secondary | ICD-10-CM | POA: Diagnosis not present

## 2015-06-13 DIAGNOSIS — K1123 Chronic sialoadenitis: Secondary | ICD-10-CM | POA: Insufficient documentation

## 2015-06-13 DIAGNOSIS — F329 Major depressive disorder, single episode, unspecified: Secondary | ICD-10-CM | POA: Diagnosis not present

## 2015-06-13 DIAGNOSIS — Z79891 Long term (current) use of opiate analgesic: Secondary | ICD-10-CM | POA: Insufficient documentation

## 2015-06-13 DIAGNOSIS — Z79899 Other long term (current) drug therapy: Secondary | ICD-10-CM | POA: Diagnosis not present

## 2015-06-13 DIAGNOSIS — M353 Polymyalgia rheumatica: Secondary | ICD-10-CM | POA: Diagnosis not present

## 2015-06-13 DIAGNOSIS — E119 Type 2 diabetes mellitus without complications: Secondary | ICD-10-CM | POA: Insufficient documentation

## 2015-06-13 DIAGNOSIS — K1120 Sialoadenitis, unspecified: Secondary | ICD-10-CM | POA: Diagnosis present

## 2015-06-13 HISTORY — DX: Basal cell carcinoma of skin of unspecified parts of face: C44.310

## 2015-06-13 HISTORY — PX: SUBMANDIBULAR GLAND EXCISION: SHX2456

## 2015-06-13 HISTORY — DX: Polymyalgia rheumatica: M35.3

## 2015-06-13 HISTORY — DX: Hypothyroidism, unspecified: E03.9

## 2015-06-13 SURGERY — EXCISION, SUBMANDIBULAR GLAND
Anesthesia: General | Site: Face | Laterality: Left

## 2015-06-13 MED ORDER — EPHEDRINE SULFATE 50 MG/ML IJ SOLN
INTRAMUSCULAR | Status: AC
  Filled 2015-06-13: qty 1

## 2015-06-13 MED ORDER — PHENYLEPHRINE HCL 10 MG/ML IJ SOLN
INTRAMUSCULAR | Status: DC | PRN
  Administered 2015-06-13 (×5): 40 ug via INTRAVENOUS

## 2015-06-13 MED ORDER — LIDOCAINE HCL (CARDIAC) 20 MG/ML IV SOLN
INTRAVENOUS | Status: AC
  Filled 2015-06-13: qty 5

## 2015-06-13 MED ORDER — BACITRACIN ZINC 500 UNIT/GM EX OINT
TOPICAL_OINTMENT | CUTANEOUS | Status: AC
  Filled 2015-06-13: qty 28.35

## 2015-06-13 MED ORDER — CEPHALEXIN 500 MG PO CAPS: 500.0000 mg | ORAL_CAPSULE | Freq: Three times a day (TID) | ORAL | Status: AC

## 2015-06-13 MED ORDER — SUCCINYLCHOLINE CHLORIDE 20 MG/ML IJ SOLN
INTRAMUSCULAR | Status: AC
  Filled 2015-06-13: qty 2

## 2015-06-13 MED ORDER — PROPOFOL 10 MG/ML IV BOLUS
INTRAVENOUS | Status: DC | PRN
  Administered 2015-06-13: 30 mg via INTRAVENOUS
  Administered 2015-06-13: 50 mg via INTRAVENOUS
  Administered 2015-06-13: 30 mg via INTRAVENOUS
  Administered 2015-06-13: 60 mg via INTRAVENOUS
  Administered 2015-06-13 (×2): 50 mg via INTRAVENOUS
  Administered 2015-06-13: 140 mg via INTRAVENOUS

## 2015-06-13 MED ORDER — FENTANYL CITRATE (PF) 100 MCG/2ML IJ SOLN
INTRAMUSCULAR | Status: AC
  Filled 2015-06-13: qty 4

## 2015-06-13 MED ORDER — ONDANSETRON HCL 4 MG/2ML IJ SOLN
INTRAMUSCULAR | Status: AC
  Filled 2015-06-13: qty 2

## 2015-06-13 MED ORDER — ATROPINE SULFATE 0.4 MG/ML IJ SOLN
INTRAMUSCULAR | Status: AC
  Filled 2015-06-13: qty 1

## 2015-06-13 MED ORDER — BACITRACIN 500 UNIT/GM EX OINT
TOPICAL_OINTMENT | CUTANEOUS | Status: DC | PRN
  Administered 2015-06-13: 1 via TOPICAL

## 2015-06-13 MED ORDER — MIDAZOLAM HCL 2 MG/2ML IJ SOLN: 1.0000 mg | INTRAMUSCULAR | Status: DC | PRN

## 2015-06-13 MED ORDER — FENTANYL CITRATE (PF) 100 MCG/2ML IJ SOLN: 25.0000 ug | INTRAMUSCULAR | Status: DC | PRN

## 2015-06-13 MED ORDER — OXYCODONE HCL 5 MG PO TABS: 5.0000 mg | ORAL_TABLET | Freq: Once | ORAL | Status: DC | PRN

## 2015-06-13 MED ORDER — CEFAZOLIN SODIUM-DEXTROSE 2-3 GM-% IV SOLR
INTRAVENOUS | Status: AC
  Filled 2015-06-13: qty 50

## 2015-06-13 MED ORDER — GLYCOPYRROLATE 0.2 MG/ML IJ SOLN
INTRAMUSCULAR | Status: DC | PRN
  Administered 2015-06-13: 0.2 mg via INTRAVENOUS

## 2015-06-13 MED ORDER — PHENYLEPHRINE HCL 10 MG/ML IJ SOLN
INTRAMUSCULAR | Status: AC
  Filled 2015-06-13: qty 1

## 2015-06-13 MED ORDER — PROPOFOL 10 MG/ML IV BOLUS
INTRAVENOUS | Status: AC
  Filled 2015-06-13: qty 20

## 2015-06-13 MED ORDER — LIDOCAINE-EPINEPHRINE 1 %-1:100000 IJ SOLN
INTRAMUSCULAR | Status: DC | PRN
  Administered 2015-06-13: 3 mL

## 2015-06-13 MED ORDER — FENTANYL CITRATE (PF) 100 MCG/2ML IJ SOLN: 50.0000 ug | INTRAMUSCULAR | Status: DC | PRN

## 2015-06-13 MED ORDER — LIDOCAINE-EPINEPHRINE 1 %-1:100000 IJ SOLN
INTRAMUSCULAR | Status: AC
  Filled 2015-06-13: qty 1

## 2015-06-13 MED ORDER — EPHEDRINE SULFATE 50 MG/ML IJ SOLN
INTRAMUSCULAR | Status: DC | PRN
  Administered 2015-06-13 (×2): 10 mg via INTRAVENOUS

## 2015-06-13 MED ORDER — TRAMADOL HCL 50 MG PO TABS: 50.0000 mg | ORAL_TABLET | Freq: Two times a day (BID) | ORAL | Status: DC | PRN

## 2015-06-13 MED ORDER — PHENYLEPHRINE 40 MCG/ML (10ML) SYRINGE FOR IV PUSH (FOR BLOOD PRESSURE SUPPORT)
PREFILLED_SYRINGE | INTRAVENOUS | Status: AC
  Filled 2015-06-13: qty 10

## 2015-06-13 MED ORDER — FENTANYL CITRATE (PF) 100 MCG/2ML IJ SOLN
INTRAMUSCULAR | Status: DC | PRN
  Administered 2015-06-13 (×2): 50 ug via INTRAVENOUS
  Administered 2015-06-13: 25 ug via INTRAVENOUS
  Administered 2015-06-13 (×3): 50 ug via INTRAVENOUS

## 2015-06-13 MED ORDER — ARTIFICIAL TEARS OP OINT
TOPICAL_OINTMENT | OPHTHALMIC | Status: AC
  Filled 2015-06-13: qty 3.5

## 2015-06-13 MED ORDER — PROPOFOL 500 MG/50ML IV EMUL
INTRAVENOUS | Status: AC
  Filled 2015-06-13: qty 50

## 2015-06-13 MED ORDER — GLYCOPYRROLATE 0.2 MG/ML IJ SOLN: 0.2000 mg | Freq: Once | INTRAMUSCULAR | Status: DC | PRN

## 2015-06-13 MED ORDER — LACTATED RINGERS IV SOLN
INTRAVENOUS | Status: DC
  Administered 2015-06-13 (×3): via INTRAVENOUS

## 2015-06-13 MED ORDER — ACETAMINOPHEN 160 MG/5ML PO SOLN: 325.0000 mg | ORAL | Status: DC | PRN

## 2015-06-13 MED ORDER — CEFAZOLIN SODIUM-DEXTROSE 2-3 GM-% IV SOLR
2.0000 g | INTRAVENOUS | Status: AC
  Administered 2015-06-13: 2 g via INTRAVENOUS

## 2015-06-13 MED ORDER — GLYCOPYRROLATE 0.2 MG/ML IJ SOLN
INTRAMUSCULAR | Status: AC
  Filled 2015-06-13: qty 1

## 2015-06-13 MED ORDER — SUCCINYLCHOLINE CHLORIDE 20 MG/ML IJ SOLN
INTRAMUSCULAR | Status: DC | PRN
  Administered 2015-06-13 (×2): 40 mg via INTRAVENOUS

## 2015-06-13 MED ORDER — SCOPOLAMINE 1 MG/3DAYS TD PT72: 1.0000 | MEDICATED_PATCH | Freq: Once | TRANSDERMAL | Status: DC | PRN

## 2015-06-13 MED ORDER — ACETAMINOPHEN 325 MG PO TABS: 325.0000 mg | ORAL_TABLET | ORAL | Status: DC | PRN

## 2015-06-13 MED ORDER — HYDROCODONE-ACETAMINOPHEN 5-325 MG PO TABS: 1.0000 | ORAL_TABLET | ORAL | Status: DC | PRN

## 2015-06-13 MED ORDER — LIDOCAINE HCL (CARDIAC) 20 MG/ML IV SOLN
INTRAVENOUS | Status: DC | PRN
  Administered 2015-06-13: 60 mg via INTRAVENOUS

## 2015-06-13 MED ORDER — OXYCODONE HCL 5 MG/5ML PO SOLN: 5.0000 mg | Freq: Once | ORAL | Status: DC | PRN

## 2015-06-13 SURGICAL SUPPLY — 61 items
BENZOIN TINCTURE PRP APPL 2/3 (GAUZE/BANDAGES/DRESSINGS) IMPLANT
BLADE SURG 15 STRL LF DISP TIS (BLADE) ×2 IMPLANT
BLADE SURG 15 STRL SS (BLADE) ×4
CANISTER SUCT 1200ML W/VALVE (MISCELLANEOUS) ×3 IMPLANT
CLEANER CAUTERY TIP 5X5 PAD (MISCELLANEOUS) IMPLANT
CLOSURE WOUND 1/2 X4 (GAUZE/BANDAGES/DRESSINGS)
CLOSURE WOUND 1/4X4 (GAUZE/BANDAGES/DRESSINGS)
CORDS BIPOLAR (ELECTRODE) IMPLANT
COVER BACK TABLE 60X90IN (DRAPES) ×3 IMPLANT
COVER MAYO STAND STRL (DRAPES) ×3 IMPLANT
DECANTER SPIKE VIAL GLASS SM (MISCELLANEOUS) IMPLANT
DRAIN PENROSE 1/4X12 LTX STRL (WOUND CARE) ×3 IMPLANT
DRAPE U-SHAPE 76X120 STRL (DRAPES) ×3 IMPLANT
ELECT COATED BLADE 2.86 ST (ELECTRODE) ×3 IMPLANT
ELECT PAIRED SUBDERMAL (MISCELLANEOUS)
ELECT REM PT RETURN 9FT ADLT (ELECTROSURGICAL) ×3
ELECTRODE PAIRED SUBDERMAL (MISCELLANEOUS) IMPLANT
ELECTRODE REM PT RTRN 9FT ADLT (ELECTROSURGICAL) ×1 IMPLANT
GAUZE SPONGE 4X4 16PLY XRAY LF (GAUZE/BANDAGES/DRESSINGS) IMPLANT
GLOVE BIOGEL PI IND STRL 7.0 (GLOVE) ×1 IMPLANT
GLOVE BIOGEL PI INDICATOR 7.0 (GLOVE) ×2
GLOVE ECLIPSE 6.5 STRL STRAW (GLOVE) ×3 IMPLANT
GLOVE SS BIOGEL STRL SZ 7.5 (GLOVE) ×1 IMPLANT
GLOVE SUPERSENSE BIOGEL SZ 7.5 (GLOVE) ×2
GOWN STRL REUS W/ TWL LRG LVL3 (GOWN DISPOSABLE) ×1 IMPLANT
GOWN STRL REUS W/TWL LRG LVL3 (GOWN DISPOSABLE) ×2
HEMOSTAT SURGICEL .5X2 ABSORB (HEMOSTASIS) IMPLANT
LOCATOR NERVE 3 VOLT (DISPOSABLE) IMPLANT
NEEDLE HYPO 25X1 1.5 SAFETY (NEEDLE) ×3 IMPLANT
NS IRRIG 1000ML POUR BTL (IV SOLUTION) ×3 IMPLANT
PACK BASIN DAY SURGERY FS (CUSTOM PROCEDURE TRAY) ×3 IMPLANT
PAD CLEANER CAUTERY TIP 5X5 (MISCELLANEOUS)
PENCIL BUTTON HOLSTER BLD 10FT (ELECTRODE) ×3 IMPLANT
PROBE NERVBE PRASS .33 (MISCELLANEOUS) IMPLANT
SLEEVE SCD COMPRESS KNEE MED (MISCELLANEOUS) ×3 IMPLANT
SPONGE GAUZE 4X4 12PLY STER LF (GAUZE/BANDAGES/DRESSINGS) IMPLANT
SPONGE INTESTINAL PEANUT (DISPOSABLE) ×3 IMPLANT
STRIP CLOSURE SKIN 1/2X4 (GAUZE/BANDAGES/DRESSINGS) IMPLANT
STRIP CLOSURE SKIN 1/4X4 (GAUZE/BANDAGES/DRESSINGS) IMPLANT
SUCTION FRAZIER TIP 10 FR DISP (SUCTIONS) IMPLANT
SUT CHROMIC 3 0 PS 2 (SUTURE) ×3 IMPLANT
SUT CHROMIC 3 0 SH 27 (SUTURE) IMPLANT
SUT CHROMIC 3 0 TIES (SUTURE) IMPLANT
SUT ETHILON 4 0 PS 2 18 (SUTURE) IMPLANT
SUT ETHILON 5 0 P 3 18 (SUTURE) ×2
SUT NYLON ETHILON 5-0 P-3 1X18 (SUTURE) ×1 IMPLANT
SUT SILK 2 0 TIES 17X18 (SUTURE) ×2
SUT SILK 2-0 18XBRD TIE BLK (SUTURE) ×1 IMPLANT
SUT SILK 3 0 SH 30 (SUTURE) IMPLANT
SUT SILK 3 0 TIES 17X18 (SUTURE) ×2
SUT SILK 3-0 18XBRD TIE BLK (SUTURE) ×1 IMPLANT
SUT VIC AB 5-0 P-3 18X BRD (SUTURE) IMPLANT
SUT VIC AB 5-0 P3 18 (SUTURE)
SWAB COLLECTION DEVICE MRSA (MISCELLANEOUS) IMPLANT
SYR BULB 3OZ (MISCELLANEOUS) ×3 IMPLANT
SYR CONTROL 10ML LL (SYRINGE) ×3 IMPLANT
TOWEL OR 17X24 6PK STRL BLUE (TOWEL DISPOSABLE) ×6 IMPLANT
TRAY DSU PREP LF (CUSTOM PROCEDURE TRAY) ×3 IMPLANT
TUBE ANAEROBIC SPECIMEN COL (MISCELLANEOUS) IMPLANT
TUBE CONNECTING 20'X1/4 (TUBING) ×1
TUBE CONNECTING 20X1/4 (TUBING) ×2 IMPLANT

## 2015-06-13 NOTE — Transfer of Care (Signed)
Immediate Anesthesia Transfer of Care Note  Patient: Karen Wong  Procedure(s) Performed: Procedure(s): EXCISION SUBMANDIBULAR GLAND, excision stone (Left)  Patient Location: PACU  Anesthesia Type:General  Level of Consciousness: sedated  Airway & Oxygen Therapy: Patient Spontanous Breathing and Patient connected to face mask oxygen  Post-op Assessment: Report given to RN and Post -op Vital signs reviewed and stable  Post vital signs: Reviewed and stable  Last Vitals:  Filed Vitals:   06/13/15 0830  BP: 128/45  Pulse: 63  Temp: 36.6 C  Resp: 20    Complications: No apparent anesthesia complications

## 2015-06-13 NOTE — Discharge Instructions (Addendum)
Start your antibiotic this afternoon Take tylenol, ultram or hydrocodone prn pain Return to see Dr Lucia Gaskins tomorrow at 11:15 to have your dressing changed. Diet as tolerated. You can use anesthetic spray or lozenges for sore throat    Post Anesthesia Home Care Instructions  Activity: Get plenty of rest for the remainder of the day. A responsible adult should stay with you for 24 hours following the procedure.  For the next 24 hours, DO NOT: -Drive a car -Paediatric nurse -Drink alcoholic beverages -Take any medication unless instructed by your physician -Make any legal decisions or sign important papers.  Meals: Start with liquid foods such as gelatin or soup. Progress to regular foods as tolerated. Avoid greasy, spicy, heavy foods. If nausea and/or vomiting occur, drink only clear liquids until the nausea and/or vomiting subsides. Call your physician if vomiting continues.  Special Instructions/Symptoms: Your throat may feel dry or sore from the anesthesia or the breathing tube placed in your throat during surgery. If this causes discomfort, gargle with warm salt water. The discomfort should disappear within 24 hours.  If you had a scopolamine patch placed behind your ear for the management of post- operative nausea and/or vomiting:  1. The medication in the patch is effective for 72 hours, after which it should be removed.  Wrap patch in a tissue and discard in the trash. Wash hands thoroughly with soap and water. 2. You may remove the patch earlier than 72 hours if you experience unpleasant side effects which may include dry mouth, dizziness or visual disturbances. 3. Avoid touching the patch. Wash your hands with soap and water after contact with the patch.     Call your surgeon if you experience:   1.  Fever over 101.0. 2.  Inability to urinate. 3.  Nausea and/or vomiting. 4.  Extreme swelling or bruising at the surgical site. 5.  Continued bleeding from the incision. 6.   Increased pain, redness or drainage from the incision. 7.  Problems related to your pain medication. 8. Any change in color, movement and/or sensation 9. Any problems and/or concerns

## 2015-06-13 NOTE — Anesthesia Postprocedure Evaluation (Signed)
  Anesthesia Post-op Note  Patient: Karen Wong  Procedure(s) Performed: Procedure(s): EXCISION SUBMANDIBULAR GLAND, excision stone (Left)  Patient Location: PACU  Anesthesia Type:General  Level of Consciousness: awake  Airway and Oxygen Therapy: Patient Spontanous Breathing  Post-op Pain: none  Post-op Assessment: Post-op Vital signs reviewed, Patient's Cardiovascular Status Stable, Respiratory Function Stable, Patent Airway, No signs of Nausea or vomiting and Pain level controlled              Post-op Vital Signs: Reviewed and stable   Last Vitals:  Filed Vitals:   06/13/15 1400  BP: 145/60  Pulse: 95  Temp: 36.6 C  Resp: 18    Complications: No apparent anesthesia complications

## 2015-06-13 NOTE — Brief Op Note (Signed)
06/13/2015  12:13 PM  PATIENT:  Karen Wong  81 y.o. female  PRE-OPERATIVE DIAGNOSIS:  SWOLLEN SUBMANDIBULAR GLAND  POST-OPERATIVE DIAGNOSIS:  SWOLLEN SUBMANDIBULAR GLAND LEFT  PROCEDURE:  Procedure(s): EXCISION SUBMANDIBULAR GLAND, excision stone (Left)  SURGEON:  Surgeon(s) and Role:    * Rozetta Nunnery, MD - Primary  PHYSICIAN ASSISTANT:   ASSISTANTS: none   ANESTHESIA:   general  EBL:  Total I/O In: 1000 [I.V.:1000] Out: -   BLOOD ADMINISTERED:none  DRAINS: Penrose drain in the left neck   LOCAL MEDICATIONS USED:  XYLOCAINE with EPI  5 cc  SPECIMEN:  Source of Specimen:  left submandibular gland  DISPOSITION OF SPECIMEN:  PATHOLOGY  COUNTS:  YES  TOURNIQUET:  * No tourniquets in log *  DICTATION: .Other Dictation: Dictation Number [id number]  PLAN OF CARE: Discharge to home after PACU  PATIENT DISPOSITION:  PACU - hemodynamically stable.   Delay start of Pharmacological VTE agent (>24hrs) due to surgical blood loss or risk of bleeding: yes

## 2015-06-13 NOTE — Anesthesia Preprocedure Evaluation (Signed)
Anesthesia Evaluation  Patient identified by MRN, date of birth, ID band Patient awake    Reviewed: Allergy & Precautions, NPO status , Patient's Chart, lab work & pertinent test results  History of Anesthesia Complications (+) PONV and history of anesthetic complications  Airway Mallampati: II  TM Distance: >3 FB Neck ROM: Full    Dental  (+) Teeth Intact   Pulmonary neg pulmonary ROS,    breath sounds clear to auscultation       Cardiovascular hypertension,  Rhythm:Regular     Neuro/Psych neg Seizures Anxiety Depression  Neuromuscular disease    GI/Hepatic negative GI ROS, Neg liver ROS,   Endo/Other  diabetesHypothyroidism   Renal/GU Renal InsufficiencyRenal disease     Musculoskeletal  (+) Arthritis ,   Abdominal   Peds  Hematology negative hematology ROS (+)   Anesthesia Other Findings   Reproductive/Obstetrics                             Anesthesia Physical Anesthesia Plan  ASA: II  Anesthesia Plan: General   Post-op Pain Management:    Induction: Intravenous  Airway Management Planned: Oral ETT  Additional Equipment: None  Intra-op Plan:   Post-operative Plan: Extubation in OR  Informed Consent: I have reviewed the patients History and Physical, chart, labs and discussed the procedure including the risks, benefits and alternatives for the proposed anesthesia with the patient or authorized representative who has indicated his/her understanding and acceptance.   Dental advisory given  Plan Discussed with: CRNA and Surgeon  Anesthesia Plan Comments:         Anesthesia Quick Evaluation

## 2015-06-13 NOTE — Anesthesia Procedure Notes (Signed)
Procedure Name: Intubation Date/Time: 06/13/2015 9:25 AM Performed by: Marrianne Mood Pre-anesthesia Checklist: Patient identified, Emergency Drugs available, Suction available, Patient being monitored and Timeout performed Patient Re-evaluated:Patient Re-evaluated prior to inductionOxygen Delivery Method: Circle System Utilized Preoxygenation: Pre-oxygenation with 100% oxygen Intubation Type: IV induction Ventilation: Mask ventilation without difficulty Laryngoscope Size: Miller, 3 and Glidescope Grade View: Grade IV Tube type: Oral Number of attempts: 3 Airway Equipment and Method: Stylet and Oral airway Placement Confirmation: ETT inserted through vocal cords under direct vision,  positive ETCO2 and breath sounds checked- equal and bilateral Secured at: 20 cm Tube secured with: Tape Dental Injury: Teeth and Oropharynx as per pre-operative assessment and Injury to lip  Difficulty Due To: Difficult Airway-  due to edematous airway, Difficult Airway- due to limited oral opening, Difficult Airway- due to large tongue and Difficult Airway- due to anterior larynx Comments: Direct laryngoscopy x 2.  Successful intubation with Glidescope x 1 attempt.

## 2015-06-14 ENCOUNTER — Encounter (HOSPITAL_BASED_OUTPATIENT_CLINIC_OR_DEPARTMENT_OTHER): Payer: Self-pay | Admitting: Otolaryngology

## 2015-06-14 NOTE — Op Note (Signed)
NAMEORIEL, OJO NO.:  [id number]  MEDICAL RECORD NO.:  [id number]  LOCATION:                               FACILITY:  [facility]  PHYSICIAN:  Leonides Sake. Lucia Gaskins, M.D.DATE OF BIRTH:  [birth year]  DATE OF PROCEDURE:  06/13/2015 DATE OF DISCHARGE:  06/13/2015                              OPERATIVE REPORT   PREOPERATIVE DIAGNOSIS:  Chronic left submandibular sialoadenitis with history of sialolithiasis.  POSTOPERATIVE DIAGNOSIS:  Chronic left submandibular sialoadenitis with history of sialolithiasis.  OPERATION PERFORMED:  Excision of left submandibular gland along with removal of a left submandibular duct stone.  SURGEON:  Leonides Sake. Lucia Gaskins, M.D.  ANESTHESIA:  General endotracheal.  COMPLICATIONS:  None.  BRIEF CLINICAL NOTE:  The patient is a 81 year old female who has had chronic recurring left submandibular gland infections.  She has been treated for a number of years with recurrent antibiotics.  When she takes the antibiotics, she gets little bit better but keeps having recurrent problems.  She brings with her a CT scan from [facility] that shows several large stones within the submandibular gland itself as well as a stone within the distal duct.  Because of the chronic sialolithiasis and recurrent submandibular gland infection, she was taken to the operating room at this time for removal of the left submandibular gland.  DESCRIPTION OF PROCEDURE:  The patient underwent general endotracheal anesthesia.  She received 2 g Ancef IV preoperatively.  The left neck was turned, prepped with Betadine solution and draped with sterile towels.  A horizontal incision was then made in the inferior edge of the palpable enlarged gland, a couple of fingerbreadths below the mandible edge.  Dissection was carried down through the subcutaneous tissue and platysmal muscle.  The gland was then encountered.  There was chronic inflammation around the gland,  and the gland was adherent to several of the surrounding muscular structures as well as the facial artery and vein.  First dissection was carried out posteriorly toward the facial artery and vein.  The vein was identified, was separated and ligated with 2-0 silk sutures to retract superiorly so as to protect the marginal mandibular nerve.  The artery was very adherent as well as adherent to the gland, and dissection of the artery was preserved, and the branch to the submandibular gland was dissected out.  It was ligated with a 2-0 silk suture and divided.  There was another branch that was ligated with 3-0 silk suture and divided.  Likewise, there was a large branch of the vein that was ligated with a 3-0 silk suture and divided. Dissection was carried around the anterior inferior portion of the gland.  The gland was then dissected up until the submandibular fossa. The lingual nerve was called some in the inflammation, but the branch that went to submandibular vein was identified.  It was divided and ligated with a 2-0 silk suture.  Likewise, a large submandibular duct was divided and ligated with a 2-0 silk suture.  The gland was then dissected out of the submandibular fossa.  It was sent as a separate specimen to Pathology.  The wound was irrigated with saline.  There were several sites of oozing  because of the chronic inflammation.  It was elected to place a quarter-inch Penrose drain.  The defect was closed with 3-0 chromic sutures to reapproximate the platysma muscle and subcutaneous tissue.  The quarter-inch Penrose drain was brought through the incision.  Then, the skin was reapproximated with a 5-0 nylon suture.  Bacitracin ointment and dressing were applied.  Next, attention was carried down intraorally, where there was a palpable stone within the submandibular duct.  The distal duct was opened up with small scissors, and the stone was easily extruded through the distal  duct. This completed the procedure.  The patient was awoken from anesthesia and was transferred to the recovery room postoperatively doing well. She will be discharged home later this morning on Keflex 500 mg t.i.d. for 5 days along with tramadol and hydrocodone p.r.n. pain.  The patient will follow up in my office tomorrow to have her Penrose drain removed and follow up in the office in 6-7 days for recheck and removal of the sutures.          ______________________________ Leonides Sake Lucia Gaskins, M.D.     CEN/MEDQ  D:  06/13/2015  T:  06/13/2015  Job:  [id number]

## 2015-09-29 DIAGNOSIS — M542 Cervicalgia: Secondary | ICD-10-CM | POA: Diagnosis not present

## 2015-10-31 DIAGNOSIS — R946 Abnormal results of thyroid function studies: Secondary | ICD-10-CM | POA: Diagnosis not present

## 2015-10-31 DIAGNOSIS — E040 Nontoxic diffuse goiter: Secondary | ICD-10-CM | POA: Diagnosis not present

## 2015-11-17 DIAGNOSIS — F331 Major depressive disorder, recurrent, moderate: Secondary | ICD-10-CM | POA: Insufficient documentation

## 2015-11-17 DIAGNOSIS — E119 Type 2 diabetes mellitus without complications: Secondary | ICD-10-CM | POA: Insufficient documentation

## 2015-11-17 DIAGNOSIS — E039 Hypothyroidism, unspecified: Secondary | ICD-10-CM | POA: Insufficient documentation

## 2015-11-17 DIAGNOSIS — E7800 Pure hypercholesterolemia, unspecified: Secondary | ICD-10-CM | POA: Insufficient documentation

## 2015-11-17 DIAGNOSIS — I10 Essential (primary) hypertension: Secondary | ICD-10-CM | POA: Insufficient documentation

## 2015-11-17 DIAGNOSIS — N183 Chronic kidney disease, stage 3 unspecified: Secondary | ICD-10-CM | POA: Insufficient documentation

## 2015-11-20 DIAGNOSIS — E039 Hypothyroidism, unspecified: Secondary | ICD-10-CM | POA: Diagnosis not present

## 2015-11-20 DIAGNOSIS — Z79899 Other long term (current) drug therapy: Secondary | ICD-10-CM | POA: Diagnosis not present

## 2015-11-20 DIAGNOSIS — M810 Age-related osteoporosis without current pathological fracture: Secondary | ICD-10-CM | POA: Diagnosis not present

## 2015-11-20 DIAGNOSIS — I129 Hypertensive chronic kidney disease with stage 1 through stage 4 chronic kidney disease, or unspecified chronic kidney disease: Secondary | ICD-10-CM | POA: Diagnosis not present

## 2015-11-20 DIAGNOSIS — M1990 Unspecified osteoarthritis, unspecified site: Secondary | ICD-10-CM | POA: Diagnosis not present

## 2015-11-20 DIAGNOSIS — N183 Chronic kidney disease, stage 3 (moderate): Secondary | ICD-10-CM | POA: Diagnosis not present

## 2015-11-20 DIAGNOSIS — E119 Type 2 diabetes mellitus without complications: Secondary | ICD-10-CM | POA: Diagnosis not present

## 2015-11-20 DIAGNOSIS — E7800 Pure hypercholesterolemia, unspecified: Secondary | ICD-10-CM | POA: Diagnosis not present

## 2015-11-20 DIAGNOSIS — F329 Major depressive disorder, single episode, unspecified: Secondary | ICD-10-CM | POA: Diagnosis not present

## 2015-11-20 DIAGNOSIS — D649 Anemia, unspecified: Secondary | ICD-10-CM | POA: Diagnosis not present

## 2015-11-20 DIAGNOSIS — H409 Unspecified glaucoma: Secondary | ICD-10-CM | POA: Diagnosis not present

## 2015-11-24 DIAGNOSIS — D519 Vitamin B12 deficiency anemia, unspecified: Secondary | ICD-10-CM | POA: Diagnosis not present

## 2015-11-24 DIAGNOSIS — D649 Anemia, unspecified: Secondary | ICD-10-CM | POA: Diagnosis not present

## 2016-01-03 DIAGNOSIS — N390 Urinary tract infection, site not specified: Secondary | ICD-10-CM | POA: Diagnosis not present

## 2016-01-30 DIAGNOSIS — H5203 Hypermetropia, bilateral: Secondary | ICD-10-CM | POA: Diagnosis not present

## 2016-01-30 DIAGNOSIS — H2513 Age-related nuclear cataract, bilateral: Secondary | ICD-10-CM | POA: Diagnosis not present

## 2016-01-30 DIAGNOSIS — H40023 Open angle with borderline findings, high risk, bilateral: Secondary | ICD-10-CM | POA: Diagnosis not present

## 2016-01-30 DIAGNOSIS — Z83511 Family history of glaucoma: Secondary | ICD-10-CM | POA: Diagnosis not present

## 2016-02-27 DIAGNOSIS — E040 Nontoxic diffuse goiter: Secondary | ICD-10-CM | POA: Diagnosis not present

## 2016-02-27 DIAGNOSIS — E063 Autoimmune thyroiditis: Secondary | ICD-10-CM | POA: Diagnosis not present

## 2016-02-27 DIAGNOSIS — R946 Abnormal results of thyroid function studies: Secondary | ICD-10-CM | POA: Diagnosis not present

## 2016-07-03 DIAGNOSIS — E063 Autoimmune thyroiditis: Secondary | ICD-10-CM | POA: Diagnosis not present

## 2016-07-03 DIAGNOSIS — E040 Nontoxic diffuse goiter: Secondary | ICD-10-CM | POA: Diagnosis not present

## 2016-07-03 DIAGNOSIS — R946 Abnormal results of thyroid function studies: Secondary | ICD-10-CM | POA: Diagnosis not present

## 2016-08-07 DIAGNOSIS — H40023 Open angle with borderline findings, high risk, bilateral: Secondary | ICD-10-CM | POA: Diagnosis not present

## 2016-08-07 DIAGNOSIS — H52203 Unspecified astigmatism, bilateral: Secondary | ICD-10-CM | POA: Diagnosis not present

## 2016-08-07 DIAGNOSIS — H04123 Dry eye syndrome of bilateral lacrimal glands: Secondary | ICD-10-CM | POA: Diagnosis not present

## 2016-08-07 DIAGNOSIS — H2513 Age-related nuclear cataract, bilateral: Secondary | ICD-10-CM | POA: Diagnosis not present

## 2016-08-07 DIAGNOSIS — H524 Presbyopia: Secondary | ICD-10-CM | POA: Diagnosis not present

## 2016-08-07 DIAGNOSIS — H5203 Hypermetropia, bilateral: Secondary | ICD-10-CM | POA: Diagnosis not present

## 2016-11-20 DIAGNOSIS — N183 Chronic kidney disease, stage 3 (moderate): Secondary | ICD-10-CM | POA: Diagnosis not present

## 2016-11-20 DIAGNOSIS — M48061 Spinal stenosis, lumbar region without neurogenic claudication: Secondary | ICD-10-CM | POA: Diagnosis not present

## 2016-11-20 DIAGNOSIS — H409 Unspecified glaucoma: Secondary | ICD-10-CM | POA: Diagnosis not present

## 2016-11-20 DIAGNOSIS — I129 Hypertensive chronic kidney disease with stage 1 through stage 4 chronic kidney disease, or unspecified chronic kidney disease: Secondary | ICD-10-CM | POA: Diagnosis not present

## 2016-11-20 DIAGNOSIS — E039 Hypothyroidism, unspecified: Secondary | ICD-10-CM | POA: Diagnosis not present

## 2016-11-20 DIAGNOSIS — E7800 Pure hypercholesterolemia, unspecified: Secondary | ICD-10-CM | POA: Diagnosis not present

## 2016-11-20 DIAGNOSIS — M810 Age-related osteoporosis without current pathological fracture: Secondary | ICD-10-CM | POA: Diagnosis not present

## 2016-11-20 DIAGNOSIS — D649 Anemia, unspecified: Secondary | ICD-10-CM | POA: Diagnosis not present

## 2016-11-20 DIAGNOSIS — E119 Type 2 diabetes mellitus without complications: Secondary | ICD-10-CM | POA: Diagnosis not present

## 2016-11-20 DIAGNOSIS — F329 Major depressive disorder, single episode, unspecified: Secondary | ICD-10-CM | POA: Diagnosis not present

## 2016-11-20 DIAGNOSIS — E1122 Type 2 diabetes mellitus with diabetic chronic kidney disease: Secondary | ICD-10-CM | POA: Diagnosis not present

## 2016-11-20 DIAGNOSIS — M1990 Unspecified osteoarthritis, unspecified site: Secondary | ICD-10-CM | POA: Diagnosis not present

## 2017-01-01 DIAGNOSIS — J189 Pneumonia, unspecified organism: Secondary | ICD-10-CM | POA: Diagnosis not present

## 2017-01-01 DIAGNOSIS — J209 Acute bronchitis, unspecified: Secondary | ICD-10-CM | POA: Diagnosis not present

## 2017-02-04 DIAGNOSIS — Z83511 Family history of glaucoma: Secondary | ICD-10-CM | POA: Diagnosis not present

## 2017-02-04 DIAGNOSIS — H40023 Open angle with borderline findings, high risk, bilateral: Secondary | ICD-10-CM | POA: Diagnosis not present

## 2017-02-04 DIAGNOSIS — H524 Presbyopia: Secondary | ICD-10-CM | POA: Diagnosis not present

## 2017-02-04 DIAGNOSIS — H52203 Unspecified astigmatism, bilateral: Secondary | ICD-10-CM | POA: Diagnosis not present

## 2017-02-04 DIAGNOSIS — H2513 Age-related nuclear cataract, bilateral: Secondary | ICD-10-CM | POA: Diagnosis not present

## 2017-02-04 DIAGNOSIS — H16223 Keratoconjunctivitis sicca, not specified as Sjogren's, bilateral: Secondary | ICD-10-CM | POA: Diagnosis not present

## 2017-02-06 DIAGNOSIS — L570 Actinic keratosis: Secondary | ICD-10-CM | POA: Diagnosis not present

## 2017-07-10 DIAGNOSIS — M1990 Unspecified osteoarthritis, unspecified site: Secondary | ICD-10-CM | POA: Diagnosis not present

## 2017-07-10 DIAGNOSIS — E119 Type 2 diabetes mellitus without complications: Secondary | ICD-10-CM | POA: Diagnosis not present

## 2017-07-10 DIAGNOSIS — M810 Age-related osteoporosis without current pathological fracture: Secondary | ICD-10-CM | POA: Diagnosis not present

## 2017-07-10 DIAGNOSIS — E7800 Pure hypercholesterolemia, unspecified: Secondary | ICD-10-CM | POA: Diagnosis not present

## 2017-07-10 DIAGNOSIS — D649 Anemia, unspecified: Secondary | ICD-10-CM | POA: Diagnosis not present

## 2017-07-10 DIAGNOSIS — I129 Hypertensive chronic kidney disease with stage 1 through stage 4 chronic kidney disease, or unspecified chronic kidney disease: Secondary | ICD-10-CM | POA: Diagnosis not present

## 2017-07-15 DIAGNOSIS — E039 Hypothyroidism, unspecified: Secondary | ICD-10-CM | POA: Diagnosis not present

## 2017-07-15 DIAGNOSIS — R29890 Loss of height: Secondary | ICD-10-CM | POA: Diagnosis not present

## 2017-07-15 DIAGNOSIS — M40204 Unspecified kyphosis, thoracic region: Secondary | ICD-10-CM | POA: Diagnosis not present

## 2017-07-23 DIAGNOSIS — Z23 Encounter for immunization: Secondary | ICD-10-CM | POA: Diagnosis not present

## 2017-08-05 DIAGNOSIS — H04123 Dry eye syndrome of bilateral lacrimal glands: Secondary | ICD-10-CM | POA: Diagnosis not present

## 2017-08-05 DIAGNOSIS — H52203 Unspecified astigmatism, bilateral: Secondary | ICD-10-CM | POA: Diagnosis not present

## 2017-08-05 DIAGNOSIS — H40023 Open angle with borderline findings, high risk, bilateral: Secondary | ICD-10-CM | POA: Diagnosis not present

## 2017-08-05 DIAGNOSIS — H2513 Age-related nuclear cataract, bilateral: Secondary | ICD-10-CM | POA: Diagnosis not present

## 2017-09-09 DIAGNOSIS — Z780 Asymptomatic menopausal state: Secondary | ICD-10-CM | POA: Diagnosis not present

## 2017-09-09 DIAGNOSIS — M810 Age-related osteoporosis without current pathological fracture: Secondary | ICD-10-CM | POA: Diagnosis not present

## 2017-10-01 DIAGNOSIS — D509 Iron deficiency anemia, unspecified: Secondary | ICD-10-CM | POA: Diagnosis not present

## 2017-10-01 DIAGNOSIS — M40204 Unspecified kyphosis, thoracic region: Secondary | ICD-10-CM | POA: Diagnosis not present

## 2017-10-01 DIAGNOSIS — Z8262 Family history of osteoporosis: Secondary | ICD-10-CM | POA: Diagnosis not present

## 2017-10-01 DIAGNOSIS — E039 Hypothyroidism, unspecified: Secondary | ICD-10-CM | POA: Diagnosis not present

## 2017-10-01 DIAGNOSIS — M810 Age-related osteoporosis without current pathological fracture: Secondary | ICD-10-CM | POA: Diagnosis not present

## 2017-10-01 DIAGNOSIS — R29890 Loss of height: Secondary | ICD-10-CM | POA: Diagnosis not present

## 2017-11-10 DIAGNOSIS — E039 Hypothyroidism, unspecified: Secondary | ICD-10-CM | POA: Diagnosis not present

## 2017-11-10 DIAGNOSIS — M810 Age-related osteoporosis without current pathological fracture: Secondary | ICD-10-CM | POA: Diagnosis not present

## 2017-11-10 DIAGNOSIS — D509 Iron deficiency anemia, unspecified: Secondary | ICD-10-CM | POA: Diagnosis not present

## 2018-02-04 DIAGNOSIS — H401111 Primary open-angle glaucoma, right eye, mild stage: Secondary | ICD-10-CM | POA: Diagnosis not present

## 2018-02-04 DIAGNOSIS — H35371 Puckering of macula, right eye: Secondary | ICD-10-CM | POA: Diagnosis not present

## 2018-02-04 DIAGNOSIS — E119 Type 2 diabetes mellitus without complications: Secondary | ICD-10-CM | POA: Diagnosis not present

## 2018-02-04 DIAGNOSIS — H401122 Primary open-angle glaucoma, left eye, moderate stage: Secondary | ICD-10-CM | POA: Diagnosis not present

## 2018-02-04 DIAGNOSIS — H25813 Combined forms of age-related cataract, bilateral: Secondary | ICD-10-CM | POA: Diagnosis not present

## 2018-02-04 DIAGNOSIS — H353131 Nonexudative age-related macular degeneration, bilateral, early dry stage: Secondary | ICD-10-CM | POA: Diagnosis not present

## 2018-03-13 DIAGNOSIS — E039 Hypothyroidism, unspecified: Secondary | ICD-10-CM | POA: Diagnosis not present

## 2018-03-23 DIAGNOSIS — I129 Hypertensive chronic kidney disease with stage 1 through stage 4 chronic kidney disease, or unspecified chronic kidney disease: Secondary | ICD-10-CM | POA: Diagnosis not present

## 2018-03-23 DIAGNOSIS — E1122 Type 2 diabetes mellitus with diabetic chronic kidney disease: Secondary | ICD-10-CM | POA: Diagnosis not present

## 2018-03-23 DIAGNOSIS — D649 Anemia, unspecified: Secondary | ICD-10-CM | POA: Diagnosis not present

## 2018-03-23 DIAGNOSIS — N183 Chronic kidney disease, stage 3 (moderate): Secondary | ICD-10-CM | POA: Diagnosis not present

## 2018-03-23 DIAGNOSIS — E039 Hypothyroidism, unspecified: Secondary | ICD-10-CM | POA: Diagnosis not present

## 2018-03-23 DIAGNOSIS — E119 Type 2 diabetes mellitus without complications: Secondary | ICD-10-CM | POA: Diagnosis not present

## 2018-03-23 DIAGNOSIS — E7800 Pure hypercholesterolemia, unspecified: Secondary | ICD-10-CM | POA: Diagnosis not present

## 2018-04-07 DIAGNOSIS — H401131 Primary open-angle glaucoma, bilateral, mild stage: Secondary | ICD-10-CM | POA: Diagnosis not present

## 2018-05-15 DIAGNOSIS — L255 Unspecified contact dermatitis due to plants, except food: Secondary | ICD-10-CM | POA: Diagnosis not present

## 2018-06-17 DIAGNOSIS — M810 Age-related osteoporosis without current pathological fracture: Secondary | ICD-10-CM | POA: Diagnosis not present

## 2018-07-15 DIAGNOSIS — E039 Hypothyroidism, unspecified: Secondary | ICD-10-CM | POA: Diagnosis not present

## 2018-07-21 DIAGNOSIS — R29890 Loss of height: Secondary | ICD-10-CM | POA: Diagnosis not present

## 2018-07-21 DIAGNOSIS — Z23 Encounter for immunization: Secondary | ICD-10-CM | POA: Diagnosis not present

## 2018-07-21 DIAGNOSIS — E039 Hypothyroidism, unspecified: Secondary | ICD-10-CM | POA: Diagnosis not present

## 2018-07-21 DIAGNOSIS — M810 Age-related osteoporosis without current pathological fracture: Secondary | ICD-10-CM | POA: Diagnosis not present

## 2018-10-08 DIAGNOSIS — H401131 Primary open-angle glaucoma, bilateral, mild stage: Secondary | ICD-10-CM | POA: Diagnosis not present

## 2018-11-24 DIAGNOSIS — E039 Hypothyroidism, unspecified: Secondary | ICD-10-CM | POA: Diagnosis not present

## 2018-11-24 DIAGNOSIS — N183 Chronic kidney disease, stage 3 (moderate): Secondary | ICD-10-CM | POA: Diagnosis not present

## 2018-11-24 DIAGNOSIS — H409 Unspecified glaucoma: Secondary | ICD-10-CM | POA: Diagnosis not present

## 2018-11-24 DIAGNOSIS — M1990 Unspecified osteoarthritis, unspecified site: Secondary | ICD-10-CM | POA: Diagnosis not present

## 2018-11-24 DIAGNOSIS — E1122 Type 2 diabetes mellitus with diabetic chronic kidney disease: Secondary | ICD-10-CM | POA: Diagnosis not present

## 2018-11-24 DIAGNOSIS — M48061 Spinal stenosis, lumbar region without neurogenic claudication: Secondary | ICD-10-CM | POA: Diagnosis not present

## 2018-11-24 DIAGNOSIS — D649 Anemia, unspecified: Secondary | ICD-10-CM | POA: Diagnosis not present

## 2018-11-24 DIAGNOSIS — E7800 Pure hypercholesterolemia, unspecified: Secondary | ICD-10-CM | POA: Diagnosis not present

## 2018-11-24 DIAGNOSIS — M810 Age-related osteoporosis without current pathological fracture: Secondary | ICD-10-CM | POA: Diagnosis not present

## 2018-11-24 DIAGNOSIS — Z79899 Other long term (current) drug therapy: Secondary | ICD-10-CM | POA: Diagnosis not present

## 2018-11-24 DIAGNOSIS — E119 Type 2 diabetes mellitus without complications: Secondary | ICD-10-CM | POA: Diagnosis not present

## 2018-11-24 DIAGNOSIS — F331 Major depressive disorder, recurrent, moderate: Secondary | ICD-10-CM | POA: Diagnosis not present

## 2018-11-24 DIAGNOSIS — I129 Hypertensive chronic kidney disease with stage 1 through stage 4 chronic kidney disease, or unspecified chronic kidney disease: Secondary | ICD-10-CM | POA: Diagnosis not present

## 2019-01-22 DIAGNOSIS — M810 Age-related osteoporosis without current pathological fracture: Secondary | ICD-10-CM | POA: Diagnosis not present

## 2019-01-22 DIAGNOSIS — R29890 Loss of height: Secondary | ICD-10-CM | POA: Diagnosis not present

## 2019-01-22 DIAGNOSIS — E039 Hypothyroidism, unspecified: Secondary | ICD-10-CM | POA: Diagnosis not present

## 2019-04-05 DIAGNOSIS — R5381 Other malaise: Secondary | ICD-10-CM | POA: Diagnosis not present

## 2019-04-05 DIAGNOSIS — J069 Acute upper respiratory infection, unspecified: Secondary | ICD-10-CM | POA: Diagnosis not present

## 2019-04-08 DIAGNOSIS — H353111 Nonexudative age-related macular degeneration, right eye, early dry stage: Secondary | ICD-10-CM | POA: Diagnosis not present

## 2019-04-08 DIAGNOSIS — E119 Type 2 diabetes mellitus without complications: Secondary | ICD-10-CM | POA: Diagnosis not present

## 2019-04-08 DIAGNOSIS — H353122 Nonexudative age-related macular degeneration, left eye, intermediate dry stage: Secondary | ICD-10-CM | POA: Diagnosis not present

## 2019-04-08 DIAGNOSIS — H401131 Primary open-angle glaucoma, bilateral, mild stage: Secondary | ICD-10-CM | POA: Diagnosis not present

## 2019-04-08 DIAGNOSIS — H25813 Combined forms of age-related cataract, bilateral: Secondary | ICD-10-CM | POA: Diagnosis not present

## 2019-07-27 DIAGNOSIS — Z862 Personal history of diseases of the blood and blood-forming organs and certain disorders involving the immune mechanism: Secondary | ICD-10-CM | POA: Diagnosis not present

## 2019-07-27 DIAGNOSIS — R5383 Other fatigue: Secondary | ICD-10-CM | POA: Diagnosis not present

## 2019-07-27 DIAGNOSIS — M810 Age-related osteoporosis without current pathological fracture: Secondary | ICD-10-CM | POA: Diagnosis not present

## 2019-07-27 DIAGNOSIS — M353 Polymyalgia rheumatica: Secondary | ICD-10-CM | POA: Diagnosis not present

## 2019-07-27 DIAGNOSIS — E039 Hypothyroidism, unspecified: Secondary | ICD-10-CM | POA: Diagnosis not present

## 2019-08-24 DIAGNOSIS — I10 Essential (primary) hypertension: Secondary | ICD-10-CM | POA: Diagnosis not present

## 2019-08-24 DIAGNOSIS — N1832 Chronic kidney disease, stage 3b: Secondary | ICD-10-CM | POA: Diagnosis not present

## 2019-08-24 DIAGNOSIS — F331 Major depressive disorder, recurrent, moderate: Secondary | ICD-10-CM | POA: Diagnosis not present

## 2019-08-24 DIAGNOSIS — Z79899 Other long term (current) drug therapy: Secondary | ICD-10-CM | POA: Diagnosis not present

## 2019-08-24 DIAGNOSIS — E119 Type 2 diabetes mellitus without complications: Secondary | ICD-10-CM | POA: Diagnosis not present

## 2019-08-24 DIAGNOSIS — E039 Hypothyroidism, unspecified: Secondary | ICD-10-CM | POA: Diagnosis not present

## 2019-08-24 DIAGNOSIS — R55 Syncope and collapse: Secondary | ICD-10-CM | POA: Diagnosis not present

## 2019-08-24 DIAGNOSIS — M48061 Spinal stenosis, lumbar region without neurogenic claudication: Secondary | ICD-10-CM | POA: Diagnosis not present

## 2019-08-24 DIAGNOSIS — D649 Anemia, unspecified: Secondary | ICD-10-CM | POA: Diagnosis not present

## 2019-08-26 DIAGNOSIS — E119 Type 2 diabetes mellitus without complications: Secondary | ICD-10-CM | POA: Diagnosis not present

## 2019-08-26 DIAGNOSIS — D649 Anemia, unspecified: Secondary | ICD-10-CM | POA: Diagnosis not present

## 2019-08-26 DIAGNOSIS — N1832 Chronic kidney disease, stage 3b: Secondary | ICD-10-CM | POA: Diagnosis not present

## 2019-09-02 NOTE — Progress Notes (Signed)
Cardiology Office Note:    Date:  09/03/2019   ID:  Clovis Cao, DOB Jan 29, 1934, MRN [id number]  PCP:  Raina Mina., MD  Cardiologist:  Shirlee More, MD   Referring MD: Raina Mina., MD  ASSESSMENT:    1. Syncope and collapse   2. Essential hypertension   3. Stage 3b chronic kidney disease   4. Mixed hyperlipidemia   5. Nonspecific abnormal electrocardiogram (ECG) (EKG)    PLAN:    In order of problems listed above:  1. Her episode of syncope appears to be orthostatic and related to her antihypertensive agents.  At times calcium channel blockers can have an accumulative effect of particularly amlodipine with a strong orthostatic component.  I told her that the right decision was made at her PCP office visit at this time and give her a brief break from her antihypertensives and reinstitute a minimum dose of ACE inhibitor.  For further evaluation 7-day ZIO monitor at risk for bradycardia arrhythmia and she needs an echocardiogram for abnormal EKG and unexplained hypotension.  If she has further episodes and implanted loop recorder may be appropriate. 2. Worsened she has symptomatic hypotension will withdraw Tranxene and put her on minimum dose of single agent ACE inhibitor.  She will follow blood pressure at home 3. Chronic CKD from recent labs 4. Severe hyperlipidemia we will discuss this at her next visit. 5. Abnormal EKG echocardiogram will be performed.  She is at risk for cardiomyopathy  Next appointment 6 weeks   Medication Adjustments/Labs and Tests Ordered: Current medicines are reviewed at length with the patient today.  Concerns regarding medicines are outlined above.  No orders of the defined types were placed in this encounter.  No orders of the defined types were placed in this encounter.    Chief Complaint  Patient presents with   Hypotension   Loss of Consciousness   Hypertension   Diabetes Mellitus    History of Present Illness:    Karen Wong is a 85 y.o. female who is being seen today for the evaluation of syncope at the request of Raina Mina., MD.  At her recent office visit her blood pressure is relatively low 110/60.  There is no record of orthostatic signs.  At that time her thiazide diuretic and amlodipine were both discontinued.   She had a recent fainting episode on the [place] store the ambulance came to check on her identified no abnormality and she did not go to the emergency room.  Her history is noteworthy for hypertension type 2 diabetes hypothyroidism and anemia. Recent labs 08/26/2019 CMP showed a creatinine 1.50 BUN 46 potassium 3.8 liver function normal GFR 32 cc ferritin 92 iron saturation 23%. Recent lipid profile performed 11/24/2018 cholesterol 259 triglycerides 132 LDL 179 HDL 69 CBC performed 08/26/2019 showed mild anemia hemoglobin 11.2 she is macrocytic.  She has been very distressed during Covid has lost a great deal of weight and even before the episode of fainting she noticed her blood pressure at home has diastolics down to 40 and she was feeling weak and lightheaded.  The day she is in the store she is wearing a mask she felt uncomfortable stood for a long period of time looking at cards and had a perception she was going to faint before the brief episode.  There was no seizure activity chest pain afterward she was normal.  Her blood pressure medications were attenuated but she is feels feeling weak and today  standing office for blood pressure is in the range of 100/50.  I will have her abstain from her ACE inhibitor till Sunday and go on one quarter at the present dose and follow her blood pressures twice a day goal will be a systolic in the range of [id number] to 140.  For further evaluation of syncope she will wear a 1 week ZIO monitor and for unexplained hypotension and sudden intolerance of antihypertensive agents an echocardiogram to screen for cardiomyopathy.  Her EKG shows typical changes of  left ventricular hypertrophy and repolarization at this time I do not think she needs an ischemia evaluation.  I reviewed her recent labs from her primary care physician's office. Past Medical History:  Diagnosis Date   Anxiety    Arthritis    Basal cell carcinoma of face    Cataracts, bilateral    Hx: of "small"   Complication of anesthesia    Depression    Diabetes mellitus without complication ([place])    Borderline diabetic; no medications   Goiter    Hx: of   Hypertension    Hypothyroidism    Pneumonia    Polymyalgia ([place]) January 2015   shoulders, elbows, wrists, hands   PONV (postoperative nausea and vomiting)    "Not in a long time"    Past Surgical History:  Procedure Laterality Date   ABDOMINAL HYSTERECTOMY     APPENDECTOMY     COLONOSCOPY W/ BIOPSIES AND POLYPECTOMY     Hx; of   LUMBAR LAMINECTOMY N/A 08/09/2013   Procedure: Left L5-S1 resection of synovial cyst, decompression left L4-5 lateral recess;  Surgeon: Jessy Oto, MD;  Location: [facility];  Service: Orthopedics;  Laterality: N/A;   SUBMANDIBULAR GLAND EXCISION Left 06/13/2015   Procedure: EXCISION SUBMANDIBULAR GLAND, excision stone;  Surgeon: Rozetta Nunnery, MD;  Location: [facility];  Service: ENT;  Laterality: Left;   TONSILLECTOMY      Current Medications: Current Meds  Medication Sig   acetaminophen (TYLENOL) 325 MG tablet Take 325 mg by mouth every 6 (six) hours as needed.    Camphor (JOINTFLEX) 3.1 % CREA Apply topically.   enalapril (VASOTEC) 10 MG tablet Take 10 mg by mouth 2 (two) times daily.   Levothyroxine Sodium (TIROSINT) 25 MCG CAPS Take 25 mcg by mouth daily before breakfast.   Multiple Vitamins-Minerals (PRESERVISION AREDS 2 PO) Take 2 capsules by mouth daily.   sertraline (ZOLOFT) 100 MG tablet Take 100 mg by mouth daily.     Allergies:   Penicillins, Prednisone, Asa [aspirin], and Demerol [meperidine]   Social History   Socioeconomic  History   Marital status: Married    Spouse name: Not on file   Number of children: Not on file   Years of education: Not on file   Highest education level: Not on file  Occupational History   Not on file  Tobacco Use   Smoking status: Never Smoker   Smokeless tobacco: Never Used  Substance and Sexual Activity   Alcohol use: No   Drug use: No   Sexual activity: Not on file  Other Topics Concern   Not on file  Social History Narrative   Not on file   Social Determinants of Health   Financial Resource Strain:    Difficulty of Paying Living Expenses: Not on file  Food Insecurity:    Worried About [place] in the Last Year: Not on file   Ran Out of Food in the Last  Year: Not on file  Transportation Needs:    Lack of Transportation (Medical): Not on file   Lack of Transportation (Non-Medical): Not on file  Physical Activity:    Days of Exercise per Week: Not on file   Minutes of Exercise per Session: Not on file  Stress:    Feeling of Stress : Not on file  Social Connections:    Frequency of Communication with Friends and Family: Not on file   Frequency of Social Gatherings with Friends and Family: Not on file   Attends Religious Services: Not on file   Active Member of Clubs or Organizations: Not on file   Attends Archivist Meetings: Not on file   Marital Status: Not on file     Family History: The patient's family history includes Heart disease in her brother, father, and mother; Hypertension in her father and mother; Sarcoidosis in her brother.  ROS:   Review of Systems  Constitution: Positive for malaise/fatigue.  HENT: Negative.   Eyes: Negative.   Cardiovascular: Positive for syncope.  Respiratory: Negative.   Endocrine: Negative.   Hematologic/Lymphatic: Negative.   Skin: Negative.   Musculoskeletal: Negative.   Gastrointestinal: Negative.   Genitourinary: Negative.   Psychiatric/Behavioral: Negative.     Allergic/Immunologic: Negative.    Please see the history of present illness.     All other systems reviewed and are negative.  EKGs/Labs/Other Studies Reviewed:    The following studies were reviewed today:   EKG:  EKG is  ordered today.  The ekg ordered today is personally reviewed and demonstrates sinus rhythm nonspecific conduction delay left ventricular hypertrophy repolarization   Physical Exam:    VS:  BP 116/64 (BP Location: Left Arm)    Pulse 71    Ht 5' (1.524 m)    Wt 112 lb 9.6 oz (51.1 kg)    SpO2 97%    BMI 21.99 kg/m     Wt Readings from Last 3 Encounters:  09/03/19 112 lb 9.6 oz (51.1 kg)  06/13/15 121 lb (54.9 kg)  08/09/13 138 lb (62.6 kg)     GEN: She is a very frail woman well quite thin in no acute distress HEENT: Normal NECK: No JVD; No carotid bruits LYMPHATICS: No lymphadenopathy CARDIAC: RRR, no murmurs, rubs, gallops RESPIRATORY:  Clear to auscultation without rales, wheezing or rhonchi  ABDOMEN: Soft, non-tender, non-distended MUSCULOSKELETAL:  No edema; No deformity  SKIN: Warm and dry NEUROLOGIC:  Alert and oriented x 3 PSYCHIATRIC:  Normal affect     Signed, Shirlee More, MD  09/03/2019 2:15 PM    [facility]

## 2019-09-03 ENCOUNTER — Encounter: Payer: Self-pay | Admitting: *Deleted

## 2019-09-03 ENCOUNTER — Other Ambulatory Visit: Payer: Self-pay

## 2019-09-03 ENCOUNTER — Ambulatory Visit (INDEPENDENT_AMBULATORY_CARE_PROVIDER_SITE_OTHER): Payer: PPO | Admitting: Cardiology

## 2019-09-03 ENCOUNTER — Ambulatory Visit (INDEPENDENT_AMBULATORY_CARE_PROVIDER_SITE_OTHER): Payer: PPO

## 2019-09-03 ENCOUNTER — Encounter: Payer: Self-pay | Admitting: Cardiology

## 2019-09-03 VITALS — BP 116/64 | HR 71 | Ht 60.0 in | Wt 112.6 lb

## 2019-09-03 DIAGNOSIS — R55 Syncope and collapse: Secondary | ICD-10-CM | POA: Diagnosis not present

## 2019-09-03 DIAGNOSIS — E782 Mixed hyperlipidemia: Secondary | ICD-10-CM

## 2019-09-03 DIAGNOSIS — R9431 Abnormal electrocardiogram [ECG] [EKG]: Secondary | ICD-10-CM | POA: Diagnosis not present

## 2019-09-03 DIAGNOSIS — I10 Essential (primary) hypertension: Secondary | ICD-10-CM | POA: Diagnosis not present

## 2019-09-03 DIAGNOSIS — N1832 Chronic kidney disease, stage 3b: Secondary | ICD-10-CM | POA: Diagnosis not present

## 2019-09-03 MED ORDER — ENALAPRIL MALEATE 10 MG PO TABS: 5.0000 mg | ORAL_TABLET | Freq: Every day | ORAL | Status: AC

## 2019-09-03 NOTE — Patient Instructions (Addendum)
Medication Instructions:  Your physician has recommended you make the following change in your medication:   DECREASE enalapril (vasotec) 10 mg: Take 1/2 tablet (5 mg) once daily. DO NOT take any of this medication until Sunday morning.   *If you need a refill on your cardiac medications before your next appointment, please call your pharmacy*  Lab Work: None  If you have labs (blood work) drawn today and your tests are completely normal, you will receive your results only by: Marland Kitchen MyChart Message (if you have MyChart) OR . A paper copy in the mail If you have any lab test that is abnormal or we need to change your treatment, we will call you to review the results.  Testing/Procedures: You had an EKG today.   Your physician has requested that you have an echocardiogram. Echocardiography is a painless test that uses sound waves to create images of your heart. It provides your doctor with information about the size and shape of your heart and how well your heart's chambers and valves are working. This procedure takes approximately one hour. There are no restrictions for this procedure.  Your physician has recommended that you wear a ZIO monitor. ZIO monitors are medical devices that record the heart's electrical activity. Doctors most often use these monitors to diagnose arrhythmias. Arrhythmias are problems with the speed or rhythm of the heartbeat. The monitor is a small, portable device. You can wear one while you do your normal daily activities. This is usually used to diagnose what is causing palpitations/syncope (passing out). Wear ZIO monitor for 7 days.   Follow-Up: At [facility], you and your health needs are our priority.  As part of our continuing mission to provide you with exceptional heart care, we have created designated Provider Care Teams.  These Care Teams include your primary Cardiologist (physician) and Advanced Practice Providers (APPs -  Physician Assistants and Nurse  Practitioners) who all work together to provide you with the care you need, when you need it.  Your next appointment:   6 week(s)  The format for your next appointment:   In Person  Provider:   Shirlee More, MD  Other Instructions **Take your blood pressure twice daily at the same time every day and record these readings. Bring your BP log to your next appointment for Dr. Bettina Gavia to review.     [facility]  Tips to measure your blood pressure correctly  To determine whether you have hypertension, a medical professional will take a blood pressure reading. How you prepare for the test, the position of your arm, and other factors can change a blood pressure reading by 10% or more. That could be enough to hide high blood pressure, start you on a drug you don't really need, or lead your doctor to incorrectly adjust your medications. National and international guidelines offer specific instructions for measuring blood pressure. If a doctor, nurse, or medical assistant isn't doing it right, don't hesitate to ask him or her to get with the guidelines. Here's what you can do to ensure a correct reading: . Don't drink a caffeinated beverage or smoke during the 30 minutes before the test. . Sit quietly for five minutes before the test begins. . During the measurement, sit in a chair with your feet on the floor and your arm supported so your elbow is at about heart level. . The inflatable part of the cuff should completely cover at least 80% of your upper arm, and the cuff should be placed  on bare skin, not over a shirt. . Don't talk during the measurement. . Have your blood pressure measured twice, with a brief break in between. If the readings are different by 5 points or more, have it done a third time. There are times to break these rules. If you sometimes feel lightheaded when getting out of bed in the morning or when you stand after sitting, you should have your blood pressure checked while seated  and then while standing to see if it falls from one position to the next. Because blood pressure varies throughout the day, your doctor will rarely diagnose hypertension on the basis of a single reading. Instead, he or she will want to confirm the measurements on at least two occasions, usually within a few weeks of one another. The exception to this rule is if you have a blood pressure reading of 180/110 mm Hg or higher. A result this high usually calls for prompt treatment. It's also a good idea to have your blood pressure measured in both arms at least once, since the reading in one arm (usually the right) may be higher than that in the left. A 2014 study in The American Journal of Medicine of nearly 3,400 people found average arm- to-arm differences in systolic blood pressure of about 5 points. The higher number should be used to make treatment decisions. In 2017, new guidelines from the [place], the [employer] of Cardiology, and nine other health organizations lowered the diagnosis of high blood pressure to 130/80 mm Hg or higher for all adults. The guidelines also redefined the various blood pressure categories to now include normal, elevated, Stage 1 hypertension, Stage 2 hypertension, and hypertensive crisis (see "Blood pressure categories"). Blood pressure categories  Blood pressure category SYSTOLIC (upper number)  DIASTOLIC (lower number)  Normal Less than 120 mm Hg and Less than 80 mm Hg  Elevated 120-129 mm Hg and Less than 80 mm Hg  High blood pressure: Stage 1 hypertension 130-139 mm Hg or 80-89 mm Hg  High blood pressure: Stage 2 hypertension 140 mm Hg or higher or 90 mm Hg or higher  Hypertensive crisis (consult your doctor immediately) Higher than 180 mm Hg and/or Higher than 120 mm Hg  Source: American Heart Association and American Stroke Association. For more on getting your blood pressure under control, buy Controlling Your Blood Pressure, a Special Health  Report from [facility].

## 2019-09-03 NOTE — Addendum Note (Signed)
Addended by: Austin Miles on: 09/03/2019 02:32 PM   Modules accepted: Orders

## 2019-09-10 DIAGNOSIS — R9431 Abnormal electrocardiogram [ECG] [EKG]: Secondary | ICD-10-CM | POA: Diagnosis not present

## 2019-09-10 DIAGNOSIS — R55 Syncope and collapse: Secondary | ICD-10-CM

## 2019-09-14 ENCOUNTER — Other Ambulatory Visit: Payer: Self-pay

## 2019-09-14 ENCOUNTER — Ambulatory Visit (INDEPENDENT_AMBULATORY_CARE_PROVIDER_SITE_OTHER): Payer: PPO

## 2019-09-14 DIAGNOSIS — E782 Mixed hyperlipidemia: Secondary | ICD-10-CM

## 2019-09-14 DIAGNOSIS — R55 Syncope and collapse: Secondary | ICD-10-CM | POA: Diagnosis not present

## 2019-09-14 DIAGNOSIS — R9431 Abnormal electrocardiogram [ECG] [EKG]: Secondary | ICD-10-CM | POA: Diagnosis not present

## 2019-09-14 DIAGNOSIS — I10 Essential (primary) hypertension: Secondary | ICD-10-CM

## 2019-09-14 NOTE — Progress Notes (Signed)
Complete echocardiogram has been performed.  Jimmy Nikai Quest RDCS, RVT 

## 2019-09-21 ENCOUNTER — Telehealth: Payer: Self-pay | Admitting: Emergency Medicine

## 2019-09-21 NOTE — Telephone Encounter (Signed)
Left message for patient to return call regarding test results.  

## 2019-10-11 ENCOUNTER — Telehealth: Payer: Self-pay | Admitting: Cardiology

## 2019-10-11 NOTE — Telephone Encounter (Signed)
Pt calling in with recent BP readings. 10am: 167/61 (pt reports that this is a normal bp readings for her in the morning before meds) 2pm: 151/61    Pt states she has a very mild headache that she has not taken any medications for.   Pt denies any caffeine intake.   She would like to know if Dr. Bettina Gavia wants to increase her enalapril because she used to take 10mg  and now she is only taking 5mg .   Advised pt that Dr. Bettina Gavia was out of office this afternoon but to continue monitoring her BP and call back if any new symptoms present of if her BP increases again. She verbalized understanding.

## 2019-10-11 NOTE — Telephone Encounter (Signed)
Pt c/o BP issue: STAT if pt c/o blurred vision, one-sided weakness or slurred speech  1. What are your last 5 BP readings? Today it 10:00 A.M.. 167/61 and at 2:00 P.M. 151/61  2. Are you having any other symptoms (ex. Dizziness, headache, blurred vision, passed out)?  Dull headache this morning   3. What is your BP issue? Pt is not sure, if this is high blood pressure or is it low

## 2019-10-12 NOTE — Telephone Encounter (Signed)
Yes increase to 10 mg daily

## 2019-10-15 NOTE — Telephone Encounter (Signed)
Called the patient and she states she has been doing much better. She thinks that her last episode was a one time thing and has felt much better since then.

## 2019-10-18 NOTE — Progress Notes (Signed)
Cardiology Office Note:    Date:  10/19/2019   ID:  Karen Wong, DOB [birth year], MRN [id number]  PCP:  Raina Mina., MD  Cardiologist:  Shirlee More, MD    Referring MD: Raina Mina., MD    ASSESSMENT:    1. Syncope and collapse   2. Orthostatic hypotension   3. Essential hypertension   4. Stage 3b chronic kidney disease    PLAN:    In order of problems listed above:  1. Improved no recurrence no significant orthostatic shift in blood pressure continue current antihypertensive therapy her CKD is stable and there is no indication that she needs a pacemaker or further cardiac testing at this time.   Next appointment: Plan to see back in the office as needed   Medication Adjustments/Labs and Tests Ordered: Current medicines are reviewed at length with the patient today.  Concerns regarding medicines are outlined above.  No orders of the defined types were placed in this encounter.  No orders of the defined types were placed in this encounter.   Chief Complaint  Patient presents with  . Follow-up  . Congestive Heart Failure  . Cardiomyopathy  . Hypotension    History of Present Illness:    Karen Wong is a 86 y.o. female with a hx of syncope due to orthostatic hypotension, CKD hypertension, hyperlipidemia and abnormal EKG last seen 09/03/2019. Compliance with diet, lifestyle and medications: Yes  Reviewed the studies below with the patient.  She has had no further episodes and her blood pressure by me checking 140/70 sitting 120/70 standing asymptomatic.  I see no indication for pacemaker her blood pressure is well controlled and she has no significant orthostasis at this time.  Echo 09/14/2019:  1. Left ventricular ejection fraction, by visual estimation, is 60 to 65%. The left ventricle has normal function. There is borderline left ventricular hypertrophy.  2. Left ventricular diastolic parameters are consistent with Grade I diastolic dysfunction  (impaired relaxation).  3. The left ventricle has no regional wall motion abnormalities.  4. Global right ventricle has normal systolic function.The right ventricular size is normal. No increase in right ventricular wall thickness.  5. Left atrial size was severely dilated.  6. Right atrial size was normal.  7. The mitral valve is normal in structure. Mild mitral valve regurgitation. No evidence of mitral stenosis.  8. Tricuspid valve regurgitation is trivial.  9. The aortic valve is normal in structure. Aortic valve regurgitation is mild. No evidence of aortic valve sclerosis or stenosis. 10. Mildly elevated pulmonary artery systolic pressure.  Study Highlights A ZIO monitor was performed for 6 days and 22 hours beginning 09/03/2019 to assess syncope.  The cardiac rhythm was sinus with bundle branch block.  Minimum average and maximum heart rates were 43, 62 and 114 bpm. There were no episodes of atrial fibrillation or flutter.  Ventricular ectopy was rare with isolated PVCs and 1 5 complex run of accelerated idioventricular rhythm at 110 bpm.  Supraventricular arrhythmia was rare with APCs and atrial couplets.  There was one 15-second run of SVT present rate of approximately 110 bpm.  There were no episodes of atrial fibrillation or flutter.  There were no diary or triggered events.   EKG 09/03/2019 independently reviewed shows sinus rhythm left bundle branch block QRS duration 162 ms  Past Medical History:  Diagnosis Date  . Anxiety   . Arthritis   . Basal cell carcinoma of face   . Cataracts, bilateral  Hx: of "small"  . Complication of anesthesia   . Depression   . Diabetes mellitus without complication ([place])    Borderline diabetic; no medications  . Goiter    Hx: of  . Hypertension   . Hypothyroidism   . Pneumonia   . Polymyalgia ([place]) January 2015   shoulders, elbows, wrists, hands  . PONV (postoperative nausea and vomiting)    "Not in a long time"    Past  Surgical History:  Procedure Laterality Date  . ABDOMINAL HYSTERECTOMY    . APPENDECTOMY    . COLONOSCOPY W/ BIOPSIES AND POLYPECTOMY     Hx; of  . LUMBAR LAMINECTOMY N/A 08/09/2013   Procedure: Left L5-S1 resection of synovial cyst, decompression left L4-5 lateral recess;  Surgeon: Jessy Oto, MD;  Location: [place];  Service: Orthopedics;  Laterality: N/A;  . SUBMANDIBULAR GLAND EXCISION Left 06/13/2015   Procedure: EXCISION SUBMANDIBULAR GLAND, excision stone;  Surgeon: Rozetta Nunnery, MD;  Location: [place];  Service: ENT;  Laterality: Left;  . TONSILLECTOMY      Current Medications: Current Meds  Medication Sig  . acetaminophen (TYLENOL) 325 MG tablet Take 325 mg by mouth every 6 (six) hours as needed.   . Camphor (JOINTFLEX) 3.1 % CREA Apply topically.  . enalapril (VASOTEC) 10 MG tablet Take 0.5 tablets (5 mg total) by mouth daily.  . Levothyroxine Sodium (TIROSINT) 25 MCG CAPS Take 25 mcg by mouth daily before breakfast.  . Multiple Vitamins-Minerals (PRESERVISION AREDS 2 PO) Take 2 capsules by mouth daily.  . sertraline (ZOLOFT) 100 MG tablet Take 100 mg by mouth daily.     Allergies:   Penicillins, Prednisone, Asa [aspirin], and Demerol [meperidine]   Social History   Socioeconomic History  . Marital status: Married    Spouse name: Not on file  . Number of children: Not on file  . Years of education: Not on file  . Highest education level: Not on file  Occupational History  . Not on file  Tobacco Use  . Smoking status: Never Smoker  . Smokeless tobacco: Never Used  Substance and Sexual Activity  . Alcohol use: No  . Drug use: No  . Sexual activity: Not on file  Other Topics Concern  . Not on file  Social History Narrative  . Not on file   Social Determinants of Health   Financial Resource Strain:   . Difficulty of Paying Living Expenses: Not on file  Food Insecurity:   . Worried About Charity fundraiser in the Last Year: Not on  file  . Ran Out of Food in the Last Year: Not on file  Transportation Needs:   . Lack of Transportation (Medical): Not on file  . Lack of Transportation (Non-Medical): Not on file  Physical Activity:   . Days of Exercise per Week: Not on file  . Minutes of Exercise per Session: Not on file  Stress:   . Feeling of Stress : Not on file  Social Connections:   . Frequency of Communication with Friends and Family: Not on file  . Frequency of Social Gatherings with Friends and Family: Not on file  . Attends Religious Services: Not on file  . Active Member of Clubs or Organizations: Not on file  . Attends Archivist Meetings: Not on file  . Marital Status: Not on file     Family History: The patient's family history includes Heart disease in her brother, father, and mother; Hypertension  in her father and mother; Sarcoidosis in her brother. ROS:   Please see the history of present illness.    All other systems reviewed and are negative.  EKGs/Labs/Other Studies Reviewed:    The following studies were reviewed today:    Recent Labs: 12/0/2020; Cr 1.49 GFR 32 cc/min K 3.8  Chol 259 TG 132 HDL 69 LDL 132 Hgb 11.2   Physical Exam:    VS:  BP 128/76   Pulse 66   Ht 5' (1.524 m)   Wt 112 lb 9.6 oz (51.1 kg)   SpO2 99%   BMI 21.99 kg/m     Wt Readings from Last 3 Encounters:  10/19/19 112 lb 9.6 oz (51.1 kg)  09/03/19 112 lb 9.6 oz (51.1 kg)  06/13/15 121 lb (54.9 kg)     GEN: Frail well nourished, well developed in no acute distress HEENT: Normal NECK: No JVD; No carotid bruits LYMPHATICS: No lymphadenopathy CARDIAC: S2 paradoxical RRR, no murmurs, rubs, gallops RESPIRATORY:  Clear to auscultation without rales, wheezing or rhonchi  ABDOMEN: Soft, non-tender, non-distended MUSCULOSKELETAL:  No edema; No deformity  SKIN: Warm and dry NEUROLOGIC:  Alert and oriented x 3 PSYCHIATRIC:  Normal affect    Signed, Shirlee More, MD  10/19/2019 3:43 PM    [facility]

## 2019-10-19 ENCOUNTER — Ambulatory Visit (INDEPENDENT_AMBULATORY_CARE_PROVIDER_SITE_OTHER): Payer: PPO | Admitting: Cardiology

## 2019-10-19 ENCOUNTER — Encounter: Payer: Self-pay | Admitting: Cardiology

## 2019-10-19 ENCOUNTER — Other Ambulatory Visit: Payer: Self-pay

## 2019-10-19 VITALS — BP 128/76 | HR 66 | Ht 60.0 in | Wt 112.6 lb

## 2019-10-19 DIAGNOSIS — R55 Syncope and collapse: Secondary | ICD-10-CM | POA: Diagnosis not present

## 2019-10-19 DIAGNOSIS — I10 Essential (primary) hypertension: Secondary | ICD-10-CM

## 2019-10-19 DIAGNOSIS — N1832 Chronic kidney disease, stage 3b: Secondary | ICD-10-CM | POA: Diagnosis not present

## 2019-10-19 DIAGNOSIS — I951 Orthostatic hypotension: Secondary | ICD-10-CM | POA: Diagnosis not present

## 2019-10-19 NOTE — Patient Instructions (Signed)
Medication Instructions:  Your physician recommends that you continue on your current medications as directed. Please refer to the Current Medication list given to you today.  *If you need a refill on your cardiac medications before your next appointment, please call your pharmacy*   Lab Work: None ordered   If you have labs (blood work) drawn today and your tests are completely normal, you will receive your results only by: . MyChart Message (if you have MyChart) OR . A paper copy in the mail If you have any lab test that is abnormal or we need to change your treatment, we will call you to review the results.   Testing/Procedures: None ordered    Follow-Up: Follow up with our office as needed   Other Instructions None   

## 2019-11-01 DIAGNOSIS — H401131 Primary open-angle glaucoma, bilateral, mild stage: Secondary | ICD-10-CM | POA: Diagnosis not present

## 2020-01-28 DIAGNOSIS — M810 Age-related osteoporosis without current pathological fracture: Secondary | ICD-10-CM | POA: Diagnosis not present

## 2020-01-28 DIAGNOSIS — E039 Hypothyroidism, unspecified: Secondary | ICD-10-CM | POA: Diagnosis not present

## 2020-03-20 ENCOUNTER — Telehealth: Payer: Self-pay | Admitting: Cardiology

## 2020-03-20 NOTE — Telephone Encounter (Signed)
See me or pCP likely easier and faster with her pCP

## 2020-03-20 NOTE — Telephone Encounter (Signed)
Spoke with the patient just now and let her know that Dr. Bettina Gavia recommended that she be seen by her PCP in regards to this since they should be able to see her much sooner than we could get her in. She verbalizes understanding and thanks me for the call back.    Encouraged patient to call back with any questions or concerns.

## 2020-03-20 NOTE — Telephone Encounter (Signed)
°  Pt c/o BP issue: STAT if pt c/o blurred vision, one-sided weakness or slurred speech  1. What are your last 5 BP readings? 180/75 this morning, now 169/70  2. Are you having any other symptoms (ex. Dizziness, headache, blurred vision, passed out)? Fatigue at times  3. What is your BP issue? Patient is concerned that her BP is running higher than normal for her, having some fatigue and not feeling well

## 2020-03-21 DIAGNOSIS — I959 Hypotension, unspecified: Secondary | ICD-10-CM | POA: Diagnosis not present

## 2020-03-21 NOTE — Telephone Encounter (Signed)
Raidyn is calling back stating she could not get in contact with her PCP office to schedule an appointment in regards to her BP. She states she would like to know what Dr. Bettina Gavia suggest in regards to it due to her BP still being elevated this morning.  Please advise.

## 2020-03-21 NOTE — Telephone Encounter (Signed)
Spoke with the patient just now and she let me know that she was not able to get in contact with her PCP to schedule an appointment. Her BP this morning was 162/69 HR 78.   I offered to schedule the patient with Dr. Harriet Masson next week as she had an opening on 03/28/20 and 03/29/20 but the patient states that she will go to the Urgent Care to be evaluated because she is also experiencing "cold" symptoms. I advised for her to call us back if she decided she would like to be seen in regards to her elevated BP and she states that she will do this.    Encouraged patient to call back with any questions or concerns.

## 2020-05-01 DIAGNOSIS — H35373 Puckering of macula, bilateral: Secondary | ICD-10-CM | POA: Diagnosis not present

## 2020-05-01 DIAGNOSIS — E119 Type 2 diabetes mellitus without complications: Secondary | ICD-10-CM | POA: Diagnosis not present

## 2020-05-01 DIAGNOSIS — H25813 Combined forms of age-related cataract, bilateral: Secondary | ICD-10-CM | POA: Diagnosis not present

## 2020-05-01 DIAGNOSIS — H353111 Nonexudative age-related macular degeneration, right eye, early dry stage: Secondary | ICD-10-CM | POA: Diagnosis not present

## 2020-05-01 DIAGNOSIS — H353122 Nonexudative age-related macular degeneration, left eye, intermediate dry stage: Secondary | ICD-10-CM | POA: Diagnosis not present

## 2020-05-01 DIAGNOSIS — H401131 Primary open-angle glaucoma, bilateral, mild stage: Secondary | ICD-10-CM | POA: Diagnosis not present

## 2020-05-25 ENCOUNTER — Other Ambulatory Visit: Payer: Self-pay | Admitting: Internal Medicine

## 2020-05-25 DIAGNOSIS — M810 Age-related osteoporosis without current pathological fracture: Secondary | ICD-10-CM

## 2020-06-09 DIAGNOSIS — M810 Age-related osteoporosis without current pathological fracture: Secondary | ICD-10-CM | POA: Diagnosis not present

## 2020-06-09 DIAGNOSIS — Z780 Asymptomatic menopausal state: Secondary | ICD-10-CM | POA: Diagnosis not present

## 2020-08-01 DIAGNOSIS — E039 Hypothyroidism, unspecified: Secondary | ICD-10-CM | POA: Diagnosis not present

## 2020-08-01 DIAGNOSIS — M810 Age-related osteoporosis without current pathological fracture: Secondary | ICD-10-CM | POA: Diagnosis not present

## 2020-12-12 DIAGNOSIS — H5310 Unspecified subjective visual disturbances: Secondary | ICD-10-CM | POA: Diagnosis not present

## 2021-01-15 DIAGNOSIS — H5310 Unspecified subjective visual disturbances: Secondary | ICD-10-CM | POA: Diagnosis not present

## 2021-01-15 DIAGNOSIS — M810 Age-related osteoporosis without current pathological fracture: Secondary | ICD-10-CM | POA: Diagnosis not present

## 2021-01-15 DIAGNOSIS — E039 Hypothyroidism, unspecified: Secondary | ICD-10-CM | POA: Diagnosis not present

## 2021-01-15 DIAGNOSIS — D509 Iron deficiency anemia, unspecified: Secondary | ICD-10-CM | POA: Insufficient documentation

## 2021-01-15 DIAGNOSIS — M353 Polymyalgia rheumatica: Secondary | ICD-10-CM | POA: Insufficient documentation

## 2021-01-15 DIAGNOSIS — Z0000 Encounter for general adult medical examination without abnormal findings: Secondary | ICD-10-CM | POA: Diagnosis not present

## 2021-01-15 DIAGNOSIS — H04123 Dry eye syndrome of bilateral lacrimal glands: Secondary | ICD-10-CM | POA: Diagnosis not present

## 2021-01-15 DIAGNOSIS — Z79899 Other long term (current) drug therapy: Secondary | ICD-10-CM | POA: Diagnosis not present

## 2021-01-15 DIAGNOSIS — H353122 Nonexudative age-related macular degeneration, left eye, intermediate dry stage: Secondary | ICD-10-CM | POA: Diagnosis not present

## 2021-01-15 DIAGNOSIS — H353111 Nonexudative age-related macular degeneration, right eye, early dry stage: Secondary | ICD-10-CM | POA: Diagnosis not present

## 2021-01-15 DIAGNOSIS — F331 Major depressive disorder, recurrent, moderate: Secondary | ICD-10-CM | POA: Diagnosis not present

## 2021-01-15 DIAGNOSIS — E7800 Pure hypercholesterolemia, unspecified: Secondary | ICD-10-CM | POA: Diagnosis not present

## 2021-01-15 DIAGNOSIS — I129 Hypertensive chronic kidney disease with stage 1 through stage 4 chronic kidney disease, or unspecified chronic kidney disease: Secondary | ICD-10-CM | POA: Diagnosis not present

## 2021-01-15 DIAGNOSIS — E119 Type 2 diabetes mellitus without complications: Secondary | ICD-10-CM | POA: Diagnosis not present

## 2021-01-15 DIAGNOSIS — N1832 Chronic kidney disease, stage 3b: Secondary | ICD-10-CM | POA: Diagnosis not present

## 2021-01-15 DIAGNOSIS — D508 Other iron deficiency anemias: Secondary | ICD-10-CM | POA: Diagnosis not present

## 2021-01-15 DIAGNOSIS — H532 Diplopia: Secondary | ICD-10-CM | POA: Diagnosis not present

## 2021-01-15 DIAGNOSIS — M1990 Unspecified osteoarthritis, unspecified site: Secondary | ICD-10-CM | POA: Diagnosis not present

## 2021-01-15 DIAGNOSIS — H409 Unspecified glaucoma: Secondary | ICD-10-CM | POA: Diagnosis not present

## 2021-01-15 DIAGNOSIS — H25813 Combined forms of age-related cataract, bilateral: Secondary | ICD-10-CM | POA: Diagnosis not present

## 2021-01-16 DIAGNOSIS — H5022 Vertical strabismus, left eye: Secondary | ICD-10-CM | POA: Diagnosis not present

## 2021-01-16 DIAGNOSIS — H532 Diplopia: Secondary | ICD-10-CM | POA: Diagnosis not present

## 2021-01-18 DIAGNOSIS — H532 Diplopia: Secondary | ICD-10-CM | POA: Insufficient documentation

## 2021-01-22 ENCOUNTER — Other Ambulatory Visit (HOSPITAL_COMMUNITY): Payer: Self-pay | Admitting: Specialist

## 2021-01-22 ENCOUNTER — Ambulatory Visit (HOSPITAL_COMMUNITY)
Admission: RE | Admit: 2021-01-22 | Discharge: 2021-01-22 | Disposition: A | Discharge status: Home / Self Care | Payer: PPO | Source: Ambulatory Visit | Attending: Specialist | Admitting: Specialist

## 2021-01-22 ENCOUNTER — Other Ambulatory Visit: Payer: Self-pay

## 2021-01-22 ENCOUNTER — Other Ambulatory Visit: Payer: Self-pay | Admitting: Specialist

## 2021-01-22 DIAGNOSIS — H532 Diplopia: Secondary | ICD-10-CM

## 2021-01-22 MED ORDER — GADOBUTROL 1 MMOL/ML IV SOLN
5.0000 mL | Freq: Once | INTRAVENOUS | Status: AC | PRN
  Administered 2021-01-22: 5 mL via INTRAVENOUS

## 2021-01-22 NOTE — Progress Notes (Signed)
Pt orders were different on paper. Clarification x 2 from Gaetano Hawthorne. MRA brain and MRA neck only.

## 2021-02-06 ENCOUNTER — Encounter: Payer: Self-pay | Admitting: Vascular Surgery

## 2021-02-06 ENCOUNTER — Other Ambulatory Visit: Payer: Self-pay

## 2021-02-06 ENCOUNTER — Ambulatory Visit: Payer: PPO | Admitting: Vascular Surgery

## 2021-02-06 VITALS — BP 146/68 | HR 61 | Temp 97.9°F | Resp 20 | Ht 60.0 in | Wt 112.0 lb

## 2021-02-06 DIAGNOSIS — I773 Arterial fibromuscular dysplasia: Secondary | ICD-10-CM | POA: Diagnosis not present

## 2021-02-06 NOTE — Progress Notes (Signed)
Patient ID: Karen Wong, female   DOB: Apr 06, 1934, 87 y.o.   MRN: [id number]  Reason for Consult: New Patient (Initial Visit)   Referred by Raina Mina., MD  Subjective:     HPI:  Karen Wong is a 87 y.o. female had an episode of double vision in her right eye.  This is subsequently resolved.  She underwent MRI of her head concerning for fibromuscular dysplasia of the left vertebral artery.  She has risk factors of vascular disease diabetes, hypertension.  She has never had vascular invention in the past.  Patient states that she cannot take aspirin due to problems she had when she was 87 years old.  She does have previous neck surgery on the left to remove a submandibular gland.  She denies any stroke, TIA or amaurosis.  No personal family history of aneurysmal disease.  Past Medical History:  Diagnosis Date  . Anxiety   . Arthritis   . Basal cell carcinoma of face   . Cataracts, bilateral    Hx: of "small"  . Complication of anesthesia   . Depression   . Diabetes mellitus without complication ([place])    Borderline diabetic; no medications  . Goiter    Hx: of  . Hypertension   . Hypothyroidism   . Pneumonia   . Polymyalgia ([place]) January 2015   shoulders, elbows, wrists, hands  . PONV (postoperative nausea and vomiting)    "Not in a long time"   Family History  Problem Relation Age of Onset  . Heart disease Mother   . Hypertension Mother   . Heart disease Father   . Hypertension Father   . Heart disease Brother   . Sarcoidosis Brother    Past Surgical History:  Procedure Laterality Date  . ABDOMINAL HYSTERECTOMY    . APPENDECTOMY    . COLONOSCOPY W/ BIOPSIES AND POLYPECTOMY     Hx; of  . LUMBAR LAMINECTOMY N/A 08/09/2013   Procedure: Left L5-S1 resection of synovial cyst, decompression left L4-5 lateral recess;  Surgeon: Jessy Oto, MD;  Location: [place];  Service: Orthopedics;  Laterality: N/A;  . SUBMANDIBULAR GLAND EXCISION Left 06/13/2015    Procedure: EXCISION SUBMANDIBULAR GLAND, excision stone;  Surgeon: Rozetta Nunnery, MD;  Location: [place];  Service: ENT;  Laterality: Left;  . TONSILLECTOMY      Short Social History:  Social History   Tobacco Use  . Smoking status: Never Smoker  . Smokeless tobacco: Never Used  Substance Use Topics  . Alcohol use: No    Allergies  Allergen Reactions  . Denosumab     Other reaction(s): flu-like symptoms, aches  . Ezetimibe     Other reaction(s): nausea  . Penicillins Hives  . Prednisone Other (See Comments)    Gets very "jumpy" and "mean"  . Statins     Other reaction(s): Unknown  . Asa [Aspirin] Palpitations  . Demerol [Meperidine] Palpitations    Current Outpatient Medications  Medication Sig Dispense Refill  . acetaminophen (TYLENOL) 325 MG tablet Take 325 mg by mouth every 6 (six) hours as needed.     . Camphor (JOINTFLEX) 3.1 % CREA Apply topically.    . enalapril (VASOTEC) 10 MG tablet Take 0.5 tablets (5 mg total) by mouth daily.    . Levothyroxine Sodium (TIROSINT) 25 MCG CAPS Take 25 mcg by mouth daily before breakfast.    . Multiple Vitamins-Minerals (PRESERVISION AREDS 2 PO) Take 2 capsules by mouth daily.    Marland Kitchen  sertraline (ZOLOFT) 100 MG tablet Take 100 mg by mouth daily.     No current facility-administered medications for this visit.    Review of Systems  Constitutional:  Constitutional negative. HENT: HENT negative.  Eyes: Positive for visual disturbance.   Respiratory: Respiratory negative.  Cardiovascular: Cardiovascular negative.  GI: Gastrointestinal negative.  Musculoskeletal: Musculoskeletal negative.  Skin: Skin negative.  Neurological: Neurological negative. Hematologic: Hematologic/lymphatic negative.  Psychiatric: Psychiatric negative.        Objective:  Objective   Vitals:   02/06/21 1124  BP: (!) 146/68  Pulse: 61  Resp: 20  Temp: 97.9 F (36.6 C)  SpO2: 95%    Physical Exam HENT:     Head:  Normocephalic.     Nose: Nose normal.  Eyes:     Pupils: Pupils are equal, round, and reactive to light.  Neck:     Vascular: No carotid bruit.  Cardiovascular:     Rate and Rhythm: Normal rate.     Pulses: Normal pulses.  Pulmonary:     Effort: Pulmonary effort is normal.  Abdominal:     General: Abdomen is flat.     Palpations: Abdomen is soft. There is no mass.  Musculoskeletal:        General: Normal range of motion.  Skin:    General: Skin is warm and dry.     Capillary Refill: Capillary refill takes less than 2 seconds.  Neurological:     General: No focal deficit present.     Mental Status: She is alert.  Psychiatric:        Mood and Affect: Mood normal.        Behavior: Behavior normal.        Thought Content: Thought content normal.        Judgment: Judgment normal.     Data: MRI neck IMPRESSION: Carotid widely patent bilaterally without stenosis  Left vertebral artery dominant. Probable mild fibromuscular dysplasia left vertebral artery without stenosis or dissection  Hypoplastic right vertebral artery with probable atherosclerotic stenosis in the mid section.     Assessment/Plan:     87 year old female with recent visual disturbance prompted MRA of the neck with concern for probable fibromuscular dysplasia of the left vertebral artery.  Right vertebral artery appears occluded with a left dominant.  I reviewed the MRI with the patient and her family today.  At the very most I would recommend aspirin the patient is adamant that she cannot take aspirin.  For this reason I would not recommend any treatment at all given that it is not definitive that she has fibromuscular dysplasia.  She does not need any specific follow-up for this we can see her on an as-needed basis.     Waynetta Sandy MD [facility]

## 2021-07-24 DIAGNOSIS — M810 Age-related osteoporosis without current pathological fracture: Secondary | ICD-10-CM | POA: Diagnosis not present

## 2021-07-24 DIAGNOSIS — E039 Hypothyroidism, unspecified: Secondary | ICD-10-CM | POA: Diagnosis not present

## 2021-08-14 DIAGNOSIS — H353122 Nonexudative age-related macular degeneration, left eye, intermediate dry stage: Secondary | ICD-10-CM | POA: Diagnosis not present

## 2021-08-14 DIAGNOSIS — H5022 Vertical strabismus, left eye: Secondary | ICD-10-CM | POA: Diagnosis not present

## 2021-08-14 DIAGNOSIS — H5310 Unspecified subjective visual disturbances: Secondary | ICD-10-CM | POA: Diagnosis not present

## 2021-08-14 DIAGNOSIS — H25813 Combined forms of age-related cataract, bilateral: Secondary | ICD-10-CM | POA: Diagnosis not present

## 2021-08-14 DIAGNOSIS — R7309 Other abnormal glucose: Secondary | ICD-10-CM | POA: Diagnosis not present

## 2021-08-14 DIAGNOSIS — H401131 Primary open-angle glaucoma, bilateral, mild stage: Secondary | ICD-10-CM | POA: Diagnosis not present

## 2021-08-14 DIAGNOSIS — H532 Diplopia: Secondary | ICD-10-CM | POA: Diagnosis not present

## 2021-08-14 DIAGNOSIS — H353111 Nonexudative age-related macular degeneration, right eye, early dry stage: Secondary | ICD-10-CM | POA: Diagnosis not present

## 2021-08-14 DIAGNOSIS — H04123 Dry eye syndrome of bilateral lacrimal glands: Secondary | ICD-10-CM | POA: Diagnosis not present

## 2022-11-14 IMAGING — MR MR MRA HEAD W/O CM
5 of 9 series · 19 of 48 positions shown · non-contrast
Comparison: None.

CLINICAL DATA: Double vision.

EXAM:
MRA HEAD WITHOUT CONTRAST
TECHNIQUE: Angiographic images of the Circle of Willis were obtained using MRA
technique without intravenous contrast.

[Series 3: (id) mt fs · axial · 1.4mm · 0.43mm/px · z∈[-82,+8]mm · 8 of 136 slices shown]
[im 1/136]
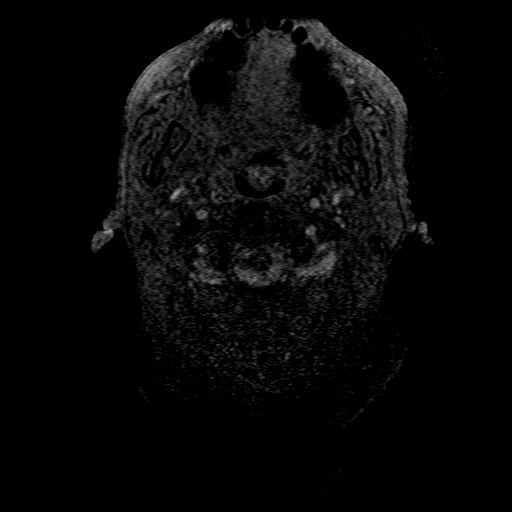
[im 20/136]
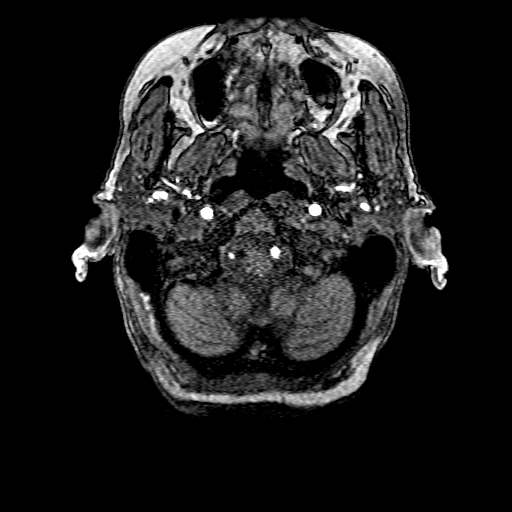
[im 39/136]
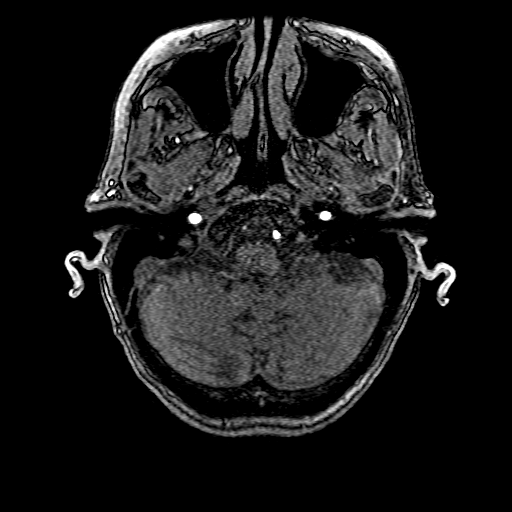
[im 58/136]
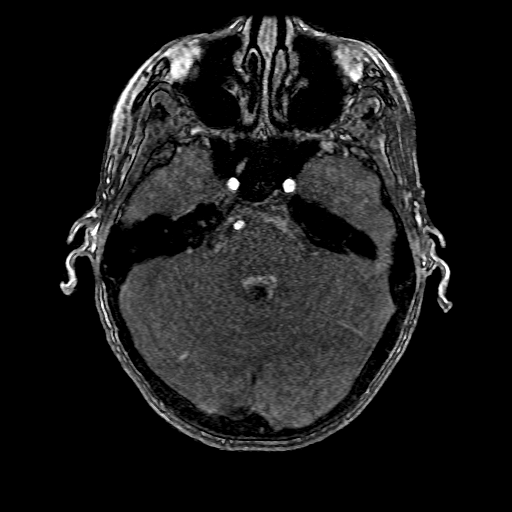
[im 78/136]
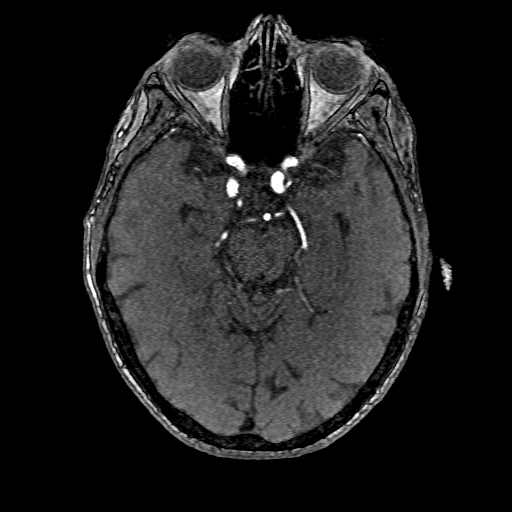
[im 97/136]
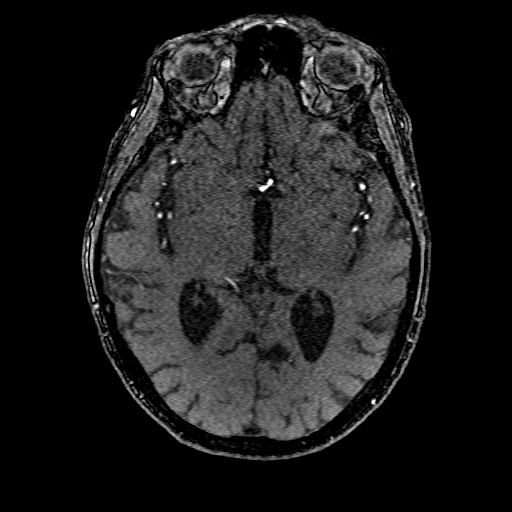
[im 116/136]
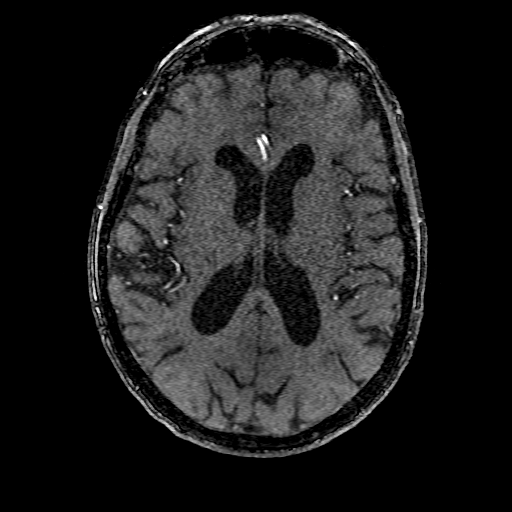
[im 136/136]
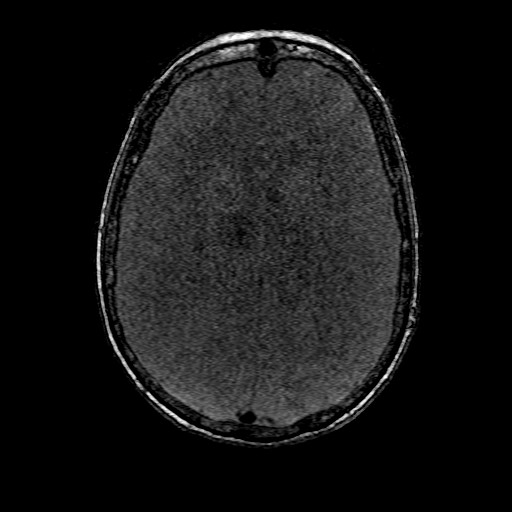

[Series 6: ax (id) · axial · 2.8mm · 0.47mm/px · z∈[-251,-149]mm · 4 of 76 slices shown]
[im 1/76]
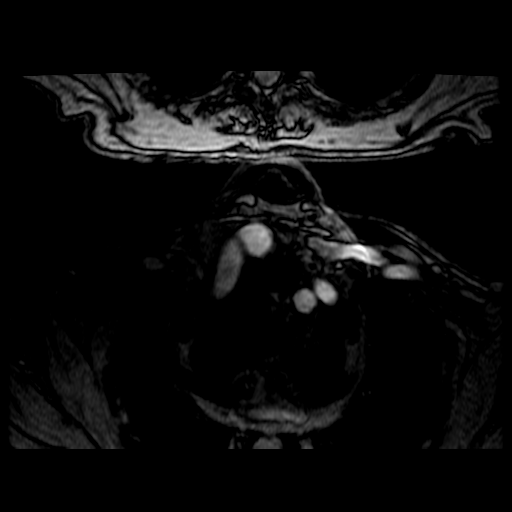
[im 26/76]
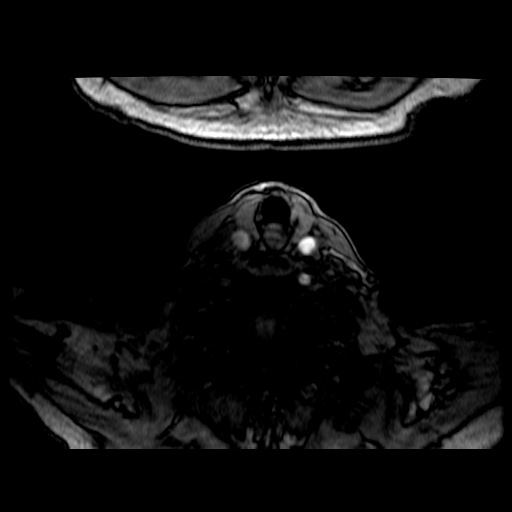
[im 51/76]
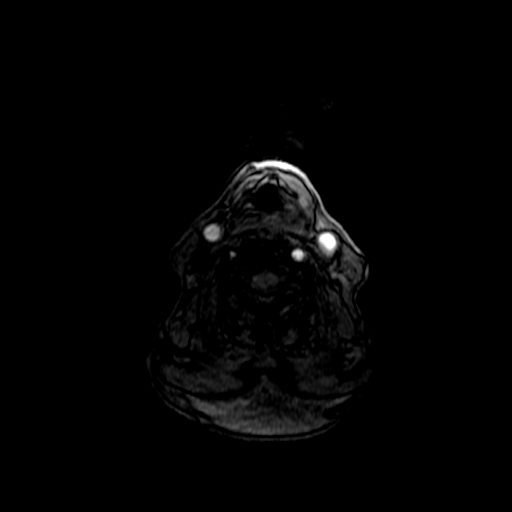
[im 76/76]
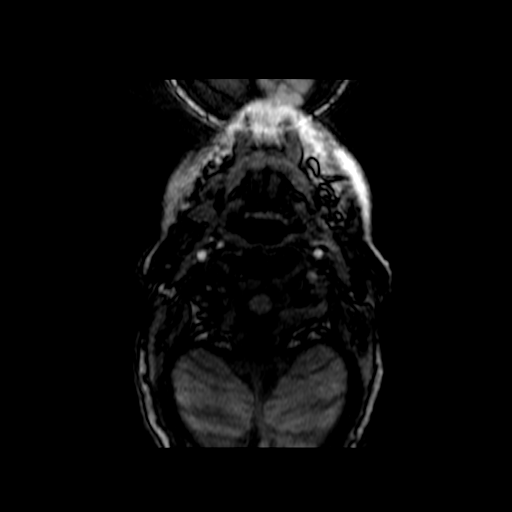

[Series 303: processed images · axial · 1.4mm · 0.43mm/px · 1 of 1 slices shown (1 of 2)]
[im 1/1]
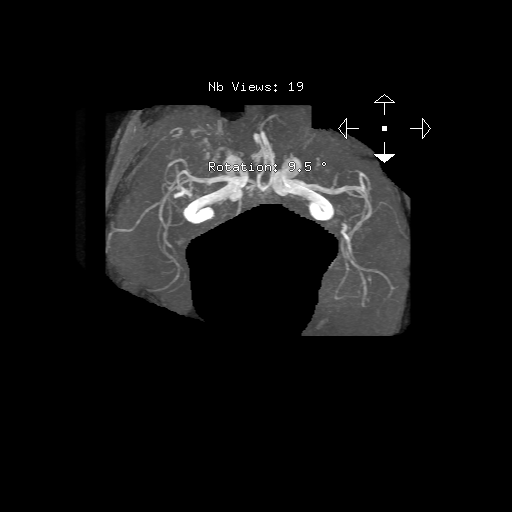

[Series 305: processed images · axial · 1.4mm · 0.43mm/px · 1 of 1 slices shown (2 of 2)]
[im 1/1]
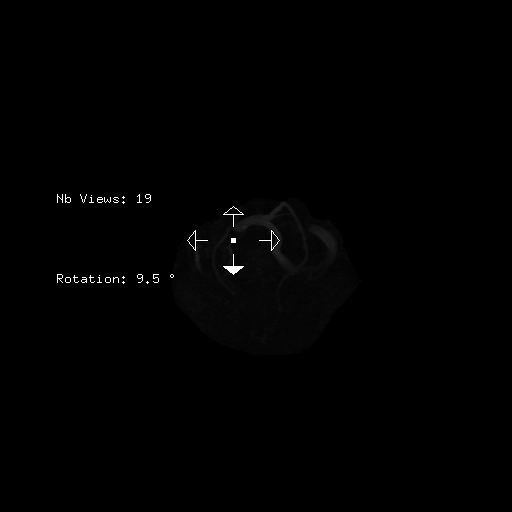

[Series 800: cor cemra ft · coronal · 1.4mm · 0.59mm/px · 5 of 108 slices shown]
[im 1/108]
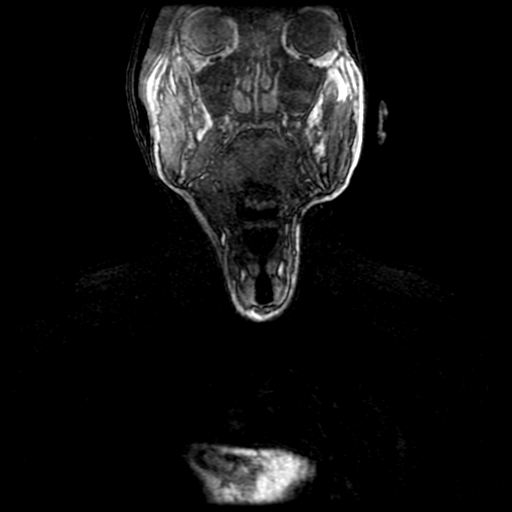
[im 22/108]
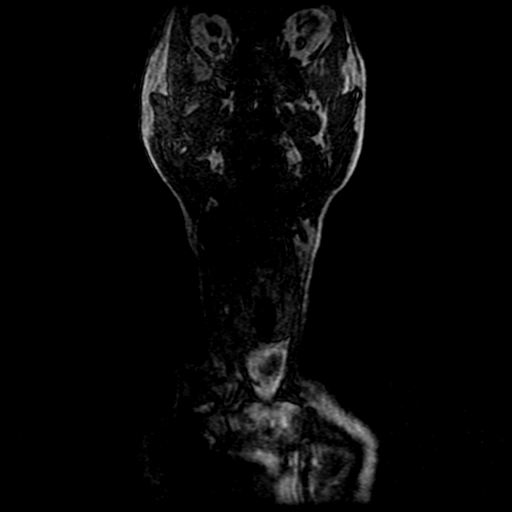
[im 43/108]
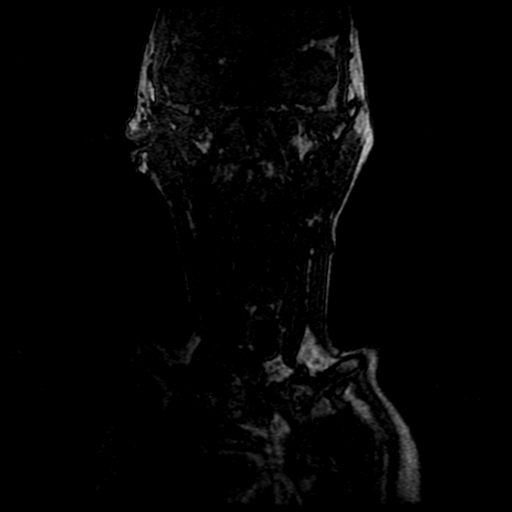
[im 65/108]
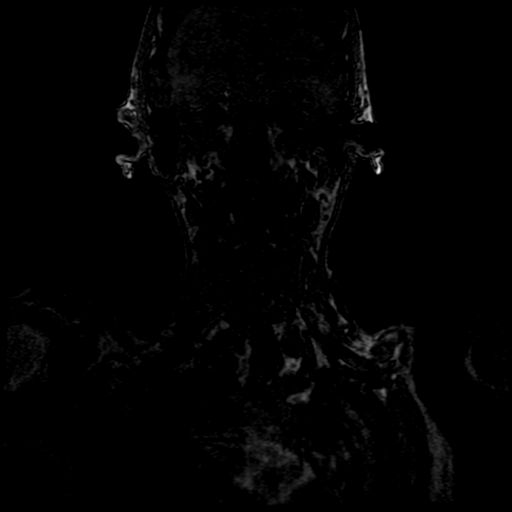
[im 86/108]
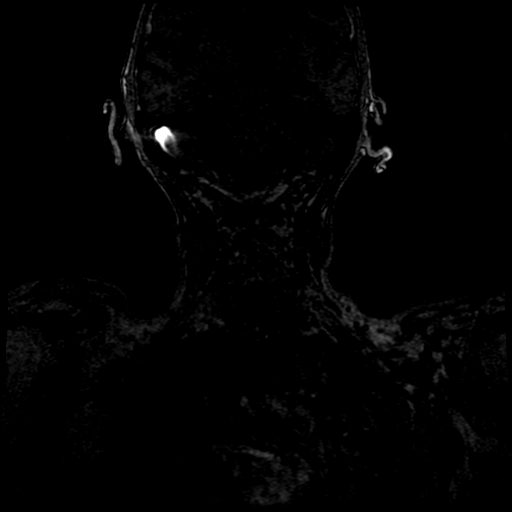

[19 of 48 positions shown; findings below may reference images not displayed]

FINDINGS: Left vertebral artery dominant and is the main supply of the
basilar. Left PICA not visualized. Left AICA patent. Small right
vertebral artery with minimal contribution to the basilar. Right
PICA patent. Basilar widely patent. Superior cerebellar and
posterior cerebral arteries patent bilaterally without stenosis.
Fetal origin right posterior cerebral artery.

Cavernous carotid widely patent bilaterally. Anterior and middle
cerebral arteries patent bilaterally without stenosis.

Negative for cerebral aneurysm.
IMPRESSION: Negative MRA head.

## 2023-02-03 ENCOUNTER — Other Ambulatory Visit: Payer: Self-pay

## 2023-02-14 ENCOUNTER — Other Ambulatory Visit: Payer: Self-pay

## 2023-02-14 MED ORDER — TIROSINT 25 MCG PO CAPS
25.0000 ug | ORAL_CAPSULE | Freq: Every day | ORAL | 4 refills | Status: DC
  Filled 2023-02-14 – 2023-02-18 (×2): qty 90, 90d supply, fill #0
  Filled 2023-06-18: qty 90, 90d supply, fill #1
  Filled 2023-06-18: qty 90, 90d supply, fill #0
  Filled 2023-11-12: qty 30, 30d supply, fill #1
  Filled 2023-12-08: qty 30, 30d supply, fill #2
  Filled 2023-12-15 – 2023-12-16 (×2): qty 90, 90d supply, fill #2

## 2023-02-18 ENCOUNTER — Other Ambulatory Visit: Payer: Self-pay

## 2023-02-18 ENCOUNTER — Other Ambulatory Visit (HOSPITAL_COMMUNITY): Payer: Self-pay

## 2023-02-19 ENCOUNTER — Other Ambulatory Visit: Payer: Self-pay

## 2023-02-19 ENCOUNTER — Other Ambulatory Visit (HOSPITAL_COMMUNITY): Payer: Self-pay

## 2023-02-20 ENCOUNTER — Other Ambulatory Visit: Payer: Self-pay

## 2023-06-18 ENCOUNTER — Other Ambulatory Visit: Payer: Self-pay

## 2023-06-18 ENCOUNTER — Other Ambulatory Visit (HOSPITAL_COMMUNITY): Payer: Self-pay

## 2023-06-19 ENCOUNTER — Other Ambulatory Visit: Payer: Self-pay

## 2023-08-07 ENCOUNTER — Other Ambulatory Visit: Payer: Self-pay

## 2023-08-07 ENCOUNTER — Emergency Department (HOSPITAL_COMMUNITY)
Admission: EM | Admit: 2023-08-07 | Discharge: 2023-08-08 | Disposition: A | Discharge status: Home / Self Care | Payer: PPO | Attending: Emergency Medicine | Admitting: Emergency Medicine

## 2023-08-07 DIAGNOSIS — E039 Hypothyroidism, unspecified: Secondary | ICD-10-CM | POA: Insufficient documentation

## 2023-08-07 DIAGNOSIS — E860 Dehydration: Secondary | ICD-10-CM | POA: Diagnosis not present

## 2023-08-07 DIAGNOSIS — R42 Dizziness and giddiness: Secondary | ICD-10-CM

## 2023-08-07 DIAGNOSIS — Z79899 Other long term (current) drug therapy: Secondary | ICD-10-CM | POA: Diagnosis not present

## 2023-08-07 DIAGNOSIS — Z85828 Personal history of other malignant neoplasm of skin: Secondary | ICD-10-CM | POA: Insufficient documentation

## 2023-08-07 DIAGNOSIS — E119 Type 2 diabetes mellitus without complications: Secondary | ICD-10-CM | POA: Insufficient documentation

## 2023-08-07 DIAGNOSIS — N289 Disorder of kidney and ureter, unspecified: Secondary | ICD-10-CM | POA: Insufficient documentation

## 2023-08-07 DIAGNOSIS — I10 Essential (primary) hypertension: Secondary | ICD-10-CM | POA: Diagnosis not present

## 2023-08-07 NOTE — ED Triage Notes (Signed)
Per son, pt is more confused than normal.

## 2023-08-07 NOTE — ED Triage Notes (Signed)
Pt from home alone. Was BIB son August Saucer. Per pt reports ongoing dizziness described as the room is spinning associated with diarrhea. Per son, mother told him this had been ongoing for a few days. Unclear onset of symptoms.

## 2023-08-08 ENCOUNTER — Emergency Department (HOSPITAL_COMMUNITY): Payer: PPO

## 2023-08-08 ENCOUNTER — Encounter (HOSPITAL_COMMUNITY): Payer: Self-pay

## 2023-08-08 LAB — URINALYSIS, ROUTINE W REFLEX MICROSCOPIC
Bilirubin Urine: NEGATIVE
Glucose, UA: NEGATIVE mg/dL
Ketones, ur: NEGATIVE mg/dL
Leukocytes,Ua: NEGATIVE
Nitrite: NEGATIVE
Protein, ur: NEGATIVE mg/dL
Specific Gravity, Urine: 1.008 (ref 1.005–1.030)
pH: 5 (ref 5.0–8.0)

## 2023-08-08 LAB — CBC WITH DIFFERENTIAL/PLATELET
Abs Immature Granulocytes: 0.01 10*3/uL (ref 0.00–0.07)
Basophils Absolute: 0 10*3/uL (ref 0.0–0.1)
Basophils Relative: 0 %
Eosinophils Absolute: 0 10*3/uL (ref 0.0–0.5)
Eosinophils Relative: 1 %
HCT: 34.9 % — ABNORMAL LOW (ref 36.0–46.0)
Hemoglobin: 11.9 g/dL — ABNORMAL LOW (ref 12.0–15.0)
Immature Granulocytes: 0 %
Lymphocytes Relative: 27 %
Lymphs Abs: 1.6 10*3/uL (ref 0.7–4.0)
MCH: 32.6 pg (ref 26.0–34.0)
MCHC: 34.1 g/dL (ref 30.0–36.0)
MCV: 95.6 fL (ref 80.0–100.0)
Monocytes Absolute: 0.5 10*3/uL (ref 0.1–1.0)
Monocytes Relative: 9 %
Neutro Abs: 3.8 10*3/uL (ref 1.7–7.7)
Neutrophils Relative %: 63 %
Platelets: 186 10*3/uL (ref 150–400)
RBC: 3.65 MIL/uL — ABNORMAL LOW (ref 3.87–5.11)
RDW: 12.6 % (ref 11.5–15.5)
WBC: 5.9 10*3/uL (ref 4.0–10.5)
nRBC: 0 % (ref 0.0–0.2)

## 2023-08-08 LAB — COMPREHENSIVE METABOLIC PANEL
ALT: 16 U/L (ref 0–44)
AST: 24 U/L (ref 15–41)
Albumin: 4.2 g/dL (ref 3.5–5.0)
Alkaline Phosphatase: 81 U/L (ref 38–126)
Anion gap: 12 (ref 5–15)
BUN: 26 mg/dL — ABNORMAL HIGH (ref 8–23)
CO2: 24 mmol/L (ref 22–32)
Calcium: 9.7 mg/dL (ref 8.9–10.3)
Chloride: 100 mmol/L (ref 98–111)
Creatinine, Ser: 1.3 mg/dL — ABNORMAL HIGH (ref 0.44–1.00)
GFR, Estimated: 40 mL/min — ABNORMAL LOW (ref >60)
Glucose, Bld: 144 mg/dL — ABNORMAL HIGH (ref 70–99)
Potassium: 3.8 mmol/L (ref 3.5–5.1)
Sodium: 136 mmol/L (ref 135–145)
Total Bilirubin: 0.5 mg/dL (ref <1.2)
Total Protein: 6.7 g/dL (ref 6.5–8.1)

## 2023-08-08 LAB — CBG MONITORING, ED: Glucose-Capillary: 141 mg/dL — ABNORMAL HIGH (ref 70–99)

## 2023-08-08 LAB — TROPONIN I (HIGH SENSITIVITY): Troponin I (High Sensitivity): 7 ng/L (ref <18)

## 2023-08-08 NOTE — ED Notes (Signed)
Patient transported to CT 

## 2023-08-08 NOTE — ED Provider Notes (Signed)
[place] EMERGENCY DEPARTMENT AT [facility] Provider Note   CSN: [id number] Arrival date & time: 08/07/23  2334     History  Chief Complaint  Patient presents with   Dizziness   Diarrhea    Karen Wong is a 89 y.o. female.  The history is provided by the patient and a relative.  Patient with history of hypertension, diabetes presents for multiple complaints.  Patient reports for the past 2 days she has had intermittent dizziness.  No syncope or falls.  She also reports brief episodes of diarrhea No chest pain or shortness of breath.  No abdominal pain.  No new medications.  She is present with her son at bedside.  He reports the patient ate dinner tonight and seemed at her baseline, but soon after told him that she felt dizzy.  She had also went to a local urgent care in [place] with a friend earlier today for the dizziness.  Patient reports nothing was done at that time  Son reports the patient is at her baseline mental status, he suspects that her perceived confusion is likely due to her lack of hearing    Past Medical History:  Diagnosis Date   Anxiety    Arthritis    Basal cell carcinoma of face    Cataracts, bilateral    Hx: of "small"   Complication of anesthesia    Depression    Diabetes mellitus without complication (HCC)    Borderline diabetic; no medications   Goiter    Hx: of   Hypertension    Hypothyroidism    Pneumonia    Polymyalgia (HCC) January 2015   shoulders, elbows, wrists, hands   PONV (postoperative nausea and vomiting)    "Not in a long time"    Home Medications Prior to Admission medications   Medication Sig Start Date End Date Taking? Authorizing Provider  acetaminophen (TYLENOL) 325 MG tablet Take 325 mg by mouth every 6 (six) hours as needed.     [provider]  Camphor 3.1 % CREA Apply topically.    [provider]  enalapril (VASOTEC) 10 MG tablet Take 0.5 tablets (5 mg total) by mouth daily.  09/03/19   Baldo Daub, MD  Levothyroxine Sodium 25 MCG CAPS Take 25 mcg by mouth daily before breakfast.    [provider]  Multiple Vitamins-Minerals (PRESERVISION AREDS 2 PO) Take 2 capsules by mouth daily.    [provider]  sertraline (ZOLOFT) 100 MG tablet Take 100 mg by mouth daily.    [provider]  TIROSINT 25 MCG CAPS Take 1 capsule (25 mcg total) by mouth daily before breakfast on an empty stomach 02/14/23         Allergies    Denosumab, Ezetimibe, Penicillins, Prednisone, Statins, Asa [aspirin], and Demerol [meperidine]    Review of Systems   Review of Systems  Constitutional:  Negative for fever.  Respiratory:  Negative for shortness of breath.   Cardiovascular:  Negative for chest pain.  Gastrointestinal:  Positive for diarrhea. Negative for vomiting.  Neurological:  Positive for dizziness. Negative for syncope.    Physical Exam Updated Vital Signs BP (!) 176/135 (BP Location: Left Arm)   Pulse 96   Temp 97.9 F (36.6 C)   Resp (!) 21   Ht 1.524 m (5')   Wt 50.8 kg   SpO2 95%   BMI 21.87 kg/m  Physical Exam CONSTITUTIONAL: Elderly, no acute distress HEAD: Normocephalic/atraumatic, no visible trauma EYES:  EOMI/PERRL, no nystagmus ENMT: Mucous membranes moist NECK: supple no meningeal signs SPINE/BACK:entire spine nontender Kyphotic spine CV: S1/S2 noted, no murmurs/rubs/gallops noted LUNGS: Lungs are clear to auscultation bilaterally, no apparent distress ABDOMEN: soft, nontender, no rebound or guarding, bowel sounds noted throughout abdomen GU:no cva tenderness NEURO: Pt is awake/alert/appropriate, moves all extremitiesx4.  No facial droop.   No arm or leg drift.  Equal handgrips EXTREMITIES: pulses normal/equal, full ROM SKIN: warm, color normal PSYCH: no abnormalities of mood noted, alert and oriented to situation  ED Results / Procedures / Treatments   Labs (all labs ordered are listed, but only abnormal results are  displayed) Labs Reviewed  URINALYSIS, ROUTINE W REFLEX MICROSCOPIC - Abnormal; Notable for the following components:      Result Value   Color, Urine STRAW (*)    Hgb urine dipstick SMALL (*)    Bacteria, UA RARE (*)    All other components within normal limits  CBC WITH DIFFERENTIAL/PLATELET - Abnormal; Notable for the following components:   RBC 3.65 (*)    Hemoglobin 11.9 (*)    HCT 34.9 (*)    All other components within normal limits  COMPREHENSIVE METABOLIC PANEL - Abnormal; Notable for the following components:   Glucose, Bld 144 (*)    BUN 26 (*)    Creatinine, Ser 1.30 (*)    GFR, Estimated 40 (*)    All other components within normal limits  CBG MONITORING, ED - Abnormal; Notable for the following components:   Glucose-Capillary 141 (*)    All other components within normal limits  TROPONIN I (HIGH SENSITIVITY)    EKG ED ECG REPORT   Date: 08/07/2023 2349  Rate: 71  Rhythm: normal sinus rhythm  QRS Axis: normal  Intervals: normal  ST/T Wave abnormalities: nonspecific ST changes  Conduction Disutrbances: LVH  Narrative Interpretation: significant artifact  I have personally reviewed the EKG tracing and agree with the computerized printout as noted.   Radiology CT HEAD WO CONTRAST  Result Date: 08/08/2023 CLINICAL DATA:  Vertigo. EXAM: CT HEAD WITHOUT CONTRAST TECHNIQUE: Contiguous axial images were obtained from the base of the skull through the vertex without intravenous contrast. RADIATION DOSE REDUCTION: This exam was performed according to the departmental dose-optimization program which includes automated exposure control, adjustment of the mA and/or kV according to patient size and/or use of iterative reconstruction technique. COMPARISON:  None Available. FINDINGS: Brain: There is mild to moderate severity cerebral atrophy with widening of the extra-axial spaces and ventricular dilatation. There are areas of decreased attenuation within the white matter  tracts of the supratentorial brain, consistent with microvascular disease changes. Vascular: Marked severity bilateral cavernous carotid artery calcification is noted. Skull: Normal. Negative for fracture or focal lesion. Sinuses/Orbits: No acute finding. Other: None. IMPRESSION: 1. Generalized cerebral atrophy with chronic white matter small vessel ischemic changes. 2. No acute intracranial abnormality. Electronically Signed   By: Aram Candela M.D.   On: 08/08/2023 01:17    Procedures Procedures    Medications Ordered in ED Medications - No data to display  ED Course/ Medical Decision Making/ A&P Clinical Course as of 08/08/23 0246  Fri Aug 08, 2023  0023 Patient reports dizziness for up to 2 days and intermittent diarrhea.  She is currently symptom-free.  She is in no acute distress without any neurodeficits.  Her history is challenging as she is hard of hearing.  Imaging and labs are pending at this time [DW]  0121 Creatinine(!): 1.30 Renal insufficiency [DW]  3664 Patient reports feeling improved.  She has been ambulatory.  No focal neurodeficits. Her blood pressure was elevated though likely just needs to take her medicines.  No signs of acute neurologic emergency, no signs of hypertensive emergency Reports recent diarrhea and does have renal insufficiency so this could be underlying dehydration.  She was able to take p.o. fluids without difficulty.  At this point patient is safe for discharge home [DW]    Clinical Course User Index [DW] Zadie Rhine, MD                                 Medical Decision Making Amount and/or Complexity of Data Reviewed Labs: ordered. Decision-making details documented in ED Course. Radiology: ordered.   This patient presents to the ED for concern of dizziness, this involves an extensive number of treatment options, and is a complaint that carries with it a high risk of complications and morbidity.  The differential diagnosis includes but  is not limited to CVA, intracranial hemorrhage, acute coronary syndrome, renal failure, urinary tract infection, electrolyte disturbance, pneumonia    Comorbidities that complicate the patient evaluation: Patient's presentation is complicated by their history of hypertension  Social Determinants of Health: Patient's  hard of hearing   increases the complexity of managing their presentation  Additional history obtained: Additional history obtained from family Records reviewed  outpatient records reviewed  Lab Tests: I Ordered, and personally interpreted labs.  The pertinent results include: Renal insufficiency  Imaging Studies ordered: I ordered imaging studies including CT scan head   I independently visualized and interpreted imaging which showed no acute findings I agree with the radiologist interpretation   Test Considered: I consider further monitoring admission, the patient is back to baseline, she is safe for outpatient management   Reevaluation: After the interventions noted above, I reevaluated the patient and found that they have :improved  Complexity of problems addressed: Patient's presentation is most consistent with  acute presentation with potential threat to life or bodily function  Disposition: After consideration of the diagnostic results and the patient's response to treatment,  I feel that the patent would benefit from discharge   .           Final Clinical Impression(s) / ED Diagnoses Final diagnoses:  Dehydration  Dizziness    Rx / DC Orders ED Discharge Orders     None         Zadie Rhine, MD 08/08/23 [phone]

## 2023-08-08 NOTE — ED Notes (Signed)
Patient ambulated without assistance.

## 2023-08-08 NOTE — ED Notes (Signed)
CBG 141; RN notified 

## 2023-08-08 NOTE — Discharge Instructions (Signed)

## 2023-08-20 ENCOUNTER — Ambulatory Visit: Payer: Self-pay

## 2023-08-20 NOTE — Telephone Encounter (Signed)
  Chief Complaint: dizziness Symptoms: dizziness when laying down worse than moving around Frequency: several weeks  Pertinent Negatives: Patient denies any sx  Disposition: [] ED /[x] Urgent Care (no appt availability in office) / [] Appointment(In office/virtual)/ []  [facility]/ [] Home Care/ [] Refused Recommended Disposition /[] [facility]/ []  Follow-up with PCP Additional Notes: spoke with Karen Wong, pt's son. He states dizziness has been going on for several weeks. Had dizziness and diarrhea and went to ED on 08/07/23. Diarrhea has went away but dizziness still present and bothersome at night. He was asking about [facility], advised we have an UC there and pt could be seen. Pt had vertigo years ago but was never seen by provider. Offered to schedule UC appt today but he wanted to talk with pt and see what she preferred. Will do walk in appt either this evening or Friday.   Summary: Dizziness   Dizziness, rooms spins when she lays down at night. Has questions about where to go?  Best contact: [phone] Karen Wong, pt's son)         Reason for Disposition  [1] MILD dizziness (e.g., walking normally) AND [2] has NOT been evaluated by doctor (or NP/PA) for this  (Exception: Dizziness caused by heat exposure, sudden standing, or poor fluid intake.)  Answer Assessment - Initial Assessment Questions 1. DESCRIPTION: "Describe your dizziness."     Rooms spinning 3. VERTIGO: "Do you feel like either you or the room is spinning or tilting?" (i.e. vertigo)     yes 4. SEVERITY: "How bad is it?"  "Do you feel like you are going to faint?" "Can you stand and walk?"   - MILD: Feels slightly dizzy, but walking normally.   - MODERATE: Feels unsteady when walking, but not falling; interferes with normal activities (e.g., school, work).   - SEVERE: Unable to walk without falling, or requires assistance to walk without falling; feels like passing out now.      Mild to  moderate  5. ONSET:  "When did the dizziness begin?"     Several days  6. AGGRAVATING FACTORS: "Does anything make it worse?" (e.g., standing, change in head position)     Laying down or movement  10. OTHER SYMPTOMS: "Do you have any other symptoms?" (e.g., fever, chest pain, vomiting, diarrhea, bleeding)       Had diarrhea but has resolved  Protocols used: Dizziness - Lightheadedness-A-AH

## 2023-11-12 ENCOUNTER — Other Ambulatory Visit (HOSPITAL_COMMUNITY): Payer: Self-pay

## 2023-11-13 ENCOUNTER — Other Ambulatory Visit: Payer: Self-pay

## 2023-11-13 ENCOUNTER — Other Ambulatory Visit (HOSPITAL_COMMUNITY): Payer: Self-pay

## 2023-11-14 ENCOUNTER — Other Ambulatory Visit: Payer: Self-pay

## 2023-11-14 ENCOUNTER — Other Ambulatory Visit (HOSPITAL_COMMUNITY): Payer: Self-pay

## 2023-12-08 ENCOUNTER — Other Ambulatory Visit (HOSPITAL_COMMUNITY): Payer: Self-pay

## 2023-12-09 ENCOUNTER — Other Ambulatory Visit (HOSPITAL_COMMUNITY): Payer: Self-pay

## 2023-12-09 ENCOUNTER — Other Ambulatory Visit: Payer: Self-pay

## 2023-12-10 ENCOUNTER — Other Ambulatory Visit (HOSPITAL_COMMUNITY): Payer: Self-pay

## 2023-12-10 ENCOUNTER — Other Ambulatory Visit: Payer: Self-pay

## 2023-12-14 ENCOUNTER — Encounter (HOSPITAL_BASED_OUTPATIENT_CLINIC_OR_DEPARTMENT_OTHER): Payer: Self-pay | Admitting: Emergency Medicine

## 2023-12-14 ENCOUNTER — Ambulatory Visit (HOSPITAL_BASED_OUTPATIENT_CLINIC_OR_DEPARTMENT_OTHER): Admission: EM | Admit: 2023-12-14 | Discharge: 2023-12-14 | Disposition: A | Discharge status: Home / Self Care

## 2023-12-14 DIAGNOSIS — R3915 Urgency of urination: Secondary | ICD-10-CM | POA: Diagnosis not present

## 2023-12-14 DIAGNOSIS — H9190 Unspecified hearing loss, unspecified ear: Secondary | ICD-10-CM | POA: Diagnosis not present

## 2023-12-14 DIAGNOSIS — R32 Unspecified urinary incontinence: Secondary | ICD-10-CM

## 2023-12-14 DIAGNOSIS — R350 Frequency of micturition: Secondary | ICD-10-CM | POA: Diagnosis not present

## 2023-12-14 NOTE — ED Provider Notes (Signed)
 [facility]    CSN: [id number] Arrival date & time: 12/14/23  1152      History   Chief Complaint No chief complaint on file.   HPI Karen Wong is a 89 y.o. female.   Patient is here with her adult son.  She is profoundly hard of hearing and having difficulty understanding [place] and answering questions.  She does not have a diagnosis of dementia.  She still lives alone.  Her son checks on her daily via telephone or in person.  She has been wearing pads for urine leakage for approximately a year.  Since Monday, 12/01/2023, she has had burning with urination, frequency and urgency of urination and some uncontrollable incontinence.  She and her son thinks she may have an acute UTI.  She denies abdominal pain, nausea, vomiting, diarrhea, constipation, fever.     Past Medical History:  Diagnosis Date   Anxiety    Arthritis    Basal cell carcinoma of face    Cataracts, bilateral    Hx: of "small"   Complication of anesthesia    Depression    Diabetes mellitus without complication (HCC)    Borderline diabetic; no medications   Goiter    Hx: of   Hypertension    Hypothyroidism    Pneumonia    Polymyalgia (HCC) January 2015   shoulders, elbows, wrists, hands   PONV (postoperative nausea and vomiting)    "Not in a long time"    Patient Active Problem List   Diagnosis Date Noted   Syncope and collapse 09/03/2019   Acquired hypothyroidism 11/17/2015   Chronic kidney disease, stage III (moderate) (HCC) 11/17/2015   Depression, major, recurrent, moderate (HCC) 11/17/2015   Diabetes type 2, controlled (HCC) 11/17/2015   Essential hypertension 11/17/2015   Pure hypercholesterolemia 11/17/2015   Spinal stenosis of lumbar region 08/09/2013    Past Surgical History:  Procedure Laterality Date   ABDOMINAL HYSTERECTOMY     APPENDECTOMY     COLONOSCOPY W/ BIOPSIES AND POLYPECTOMY     Hx; of   LUMBAR LAMINECTOMY N/A 08/09/2013   Procedure: Left L5-S1 resection of  synovial cyst, decompression left L4-5 lateral recess;  Surgeon: Kerrin Champagne, MD;  Location: [facility] OR;  Service: Orthopedics;  Laterality: N/A;   SUBMANDIBULAR GLAND EXCISION Left 06/13/2015   Procedure: EXCISION SUBMANDIBULAR GLAND, excision stone;  Surgeon: Drema Halon, MD;  Location: [facility];  Service: ENT;  Laterality: Left;   TONSILLECTOMY      OB History   No obstetric history on file.      Home Medications    Prior to Admission medications   Medication Sig Start Date End Date Taking? Authorizing Provider  Levothyroxine Sodium 25 MCG CAPS Take 25 mcg by mouth daily before breakfast.   Yes [provider]  TIROSINT 25 MCG CAPS Take 1 capsule (25 mcg total) by mouth daily before breakfast on an empty stomach 02/14/23  Yes   acetaminophen (TYLENOL) 325 MG tablet Take 325 mg by mouth every 6 (six) hours as needed.     [provider]  Camphor 3.1 % CREA Apply topically.    [provider]  enalapril (VASOTEC) 10 MG tablet Take 0.5 tablets (5 mg total) by mouth daily. 09/03/19   Baldo Daub, MD  Multiple Vitamins-Minerals (PRESERVISION AREDS 2 PO) Take 2 capsules by mouth daily.    [provider]  sertraline (ZOLOFT) 100 MG tablet Take 100 mg by mouth daily.  [provider]    Family History Family History  Problem Relation Age of Onset   Heart disease Mother    Hypertension Mother    Heart disease Father    Hypertension Father    Heart disease Brother    Sarcoidosis Brother     Social History Social History   Tobacco Use   Smoking status: Never   Smokeless tobacco: Never  Vaping Use   Vaping status: Never Used  Substance Use Topics   Alcohol use: No   Drug use: No     Allergies   Denosumab, Ezetimibe, Penicillins, Prednisone, Statins, Asa [aspirin], and Demerol [meperidine]   Review of Systems Review of Systems  Constitutional:  Negative for chills and fever.  HENT:  Negative for  ear pain and sore throat.   Eyes:  Negative for pain and visual disturbance.  Respiratory:  Negative for cough and shortness of breath.   Cardiovascular:  Negative for chest pain and palpitations.  Gastrointestinal:  Negative for abdominal pain, constipation, diarrhea, nausea and vomiting.  Genitourinary:  Positive for dysuria, enuresis, frequency and urgency. Negative for hematuria.  Musculoskeletal:  Negative for arthralgias and back pain.  Skin:  Negative for color change and rash.  Neurological:  Negative for seizures and syncope.  All other systems reviewed and are negative.    Physical Exam Triage Vital Signs ED Triage Vitals  Encounter Vitals Group     BP 12/14/23 1220 120/68     Systolic BP Percentile --      Diastolic BP Percentile --      Pulse Rate 12/14/23 1220 83     Resp 12/14/23 1220 20     Temp 12/14/23 1220 97.8 F (36.6 C)     Temp Source 12/14/23 1220 Oral     SpO2 12/14/23 1220 95 %     Weight --      Height --      Head Circumference --      Peak Flow --      Pain Score 12/14/23 1217 0     Pain Loc --      Pain Education --      Exclude from Growth Chart --    No data found.  Updated Vital Signs BP 120/68 (BP Location: Right Arm)   Pulse 83   Temp 97.8 F (36.6 C) (Oral)   Resp 20   SpO2 95%   Visual Acuity Right Eye Distance:   Left Eye Distance:   Bilateral Distance:    Right Eye Near:   Left Eye Near:    Bilateral Near:     Physical Exam Vitals and nursing note reviewed.  Constitutional:      General: She is not in acute distress.    Appearance: She is well-developed. She is not ill-appearing or toxic-appearing.  HENT:     Head: Normocephalic and atraumatic.     Right Ear: Tympanic membrane, ear canal and external ear normal. Decreased hearing (Profoundly hard of hearing.  Son reports she has hearing aids but she never wears them.) noted.     Left Ear: Tympanic membrane, ear canal and external ear normal. Decreased hearing  (Profoundly hard of hearing. Son reports she has hearing aids but she never wears them.) noted.     Nose: No congestion or rhinorrhea.     Right Sinus: No maxillary sinus tenderness or frontal sinus tenderness.     Left Sinus: No maxillary sinus tenderness or frontal sinus tenderness.  Mouth/Throat:     Lips: Pink.     Mouth: Mucous membranes are moist.     Pharynx: Uvula midline. No oropharyngeal exudate or posterior oropharyngeal erythema.     Tonsils: No tonsillar exudate.  Eyes:     Conjunctiva/sclera: Conjunctivae normal.     Pupils: Pupils are equal, round, and reactive to light.  Cardiovascular:     Rate and Rhythm: Normal rate and regular rhythm.     Heart sounds: S1 normal and S2 normal. No murmur heard. Pulmonary:     Effort: Pulmonary effort is normal. No respiratory distress.     Breath sounds: Normal breath sounds. No decreased breath sounds, wheezing, rhonchi or rales.  Abdominal:     General: Bowel sounds are normal.     Palpations: Abdomen is soft.     Tenderness: There is no abdominal tenderness.  Musculoskeletal:        General: No swelling.     Cervical back: Neck supple.  Lymphadenopathy:     Head:     Right side of head: No submental, submandibular, tonsillar, preauricular or posterior auricular adenopathy.     Left side of head: No submental, submandibular, tonsillar, preauricular or posterior auricular adenopathy.     Cervical: No cervical adenopathy.     Right cervical: No superficial cervical adenopathy.    Left cervical: No superficial cervical adenopathy.  Skin:    General: Skin is warm and dry.     Capillary Refill: Capillary refill takes less than 2 seconds.     Findings: No rash.  Neurological:     Mental Status: She is alert and oriented to person, place, and time.  Psychiatric:        Mood and Affect: Mood normal.      UC Treatments / Results  Labs (all labs ordered are listed, but only abnormal results are displayed) Labs Reviewed -  No data to display   EKG   Radiology No results found.  Procedures Procedures (including critical care time)  Medications Ordered in UC Medications - No data to display  Initial Impression / Assessment and Plan / UC Course  I have reviewed the triage vital signs and the nursing notes.  Pertinent labs & imaging results that were available during my care of the patient were reviewed by me and considered in my medical decision making (see chart for details).     Unable to collect a urine specimen.  See below for more details.  Staff and I both tried to help her get a good specimen in the end her son also helped her void.  The visit took over 30 minutes.  No medication at this time.  Will set up plan of care once we have a urinalysis available to evaluate. Final Clinical Impressions(s) / UC Diagnoses   Final diagnoses:  Hard of hearing  Urinary frequency  Urinary urgency  Urinary incontinence, unspecified type     Discharge Instructions      Initially was unable to void.  Then she voided into the toilet without catching a specimen.  And her most recent specimen has a small ball of stool in it and is contaminated.  Provided supplies and she will collect the specimen at home with the help of her family.  When she brings a specimen and it needs to be the same day it is collected.  We can evaluate it and see if there is anything we need to treat.  For now her exam was normal except for  her hearing issues.  No medication at this time.  Due to communication issues and multiple attempts to collect urine.  The visit took longer than expected.     ED Prescriptions   None    PDMP not reviewed this encounter.   Prescilla Sours, FNP 12/14/23 [phone]

## 2023-12-14 NOTE — ED Triage Notes (Signed)
 Pt c/o urinary incontinence due to urine constantly coming out, burning during urination x 1 week

## 2023-12-14 NOTE — Discharge Instructions (Addendum)
 Initially was unable to void.  Then she voided into the toilet without catching a specimen.  And her most recent specimen has a small ball of stool in it and is contaminated.  Provided supplies and she will collect the specimen at home with the help of her family.  When she brings a specimen and it needs to be the same day it is collected.  We can evaluate it and see if there is anything we need to treat.  For now her exam was normal except for her hearing issues.  No medication at this time.  Due to communication issues and multiple attempts to collect urine.  The visit took longer than expected.

## 2023-12-15 ENCOUNTER — Other Ambulatory Visit (HOSPITAL_BASED_OUTPATIENT_CLINIC_OR_DEPARTMENT_OTHER): Payer: Self-pay

## 2023-12-15 ENCOUNTER — Other Ambulatory Visit: Payer: Self-pay

## 2023-12-15 ENCOUNTER — Encounter (HOSPITAL_BASED_OUTPATIENT_CLINIC_OR_DEPARTMENT_OTHER): Payer: Self-pay

## 2023-12-15 ENCOUNTER — Ambulatory Visit (HOSPITAL_BASED_OUTPATIENT_CLINIC_OR_DEPARTMENT_OTHER)
Admission: EM | Admit: 2023-12-15 | Discharge: 2023-12-15 | Disposition: A | Discharge status: Home / Self Care | Attending: Family Medicine | Admitting: Family Medicine

## 2023-12-15 DIAGNOSIS — R300 Dysuria: Secondary | ICD-10-CM | POA: Insufficient documentation

## 2023-12-15 LAB — POCT URINALYSIS DIP (MANUAL ENTRY)
Bilirubin, UA: NEGATIVE
Glucose, UA: NEGATIVE mg/dL
Ketones, POC UA: NEGATIVE mg/dL
Nitrite, UA: POSITIVE — AB
Protein Ur, POC: NEGATIVE mg/dL
Spec Grav, UA: 1.015 (ref 1.010–1.025)
Urobilinogen, UA: 0.2 U/dL
pH, UA: 5.5 (ref 5.0–8.0)

## 2023-12-15 MED ORDER — CEPHALEXIN 500 MG PO CAPS: 500.0000 mg | ORAL_CAPSULE | Freq: Two times a day (BID) | ORAL | 0 refills | Status: DC

## 2023-12-15 MED ORDER — CEPHALEXIN 500 MG PO CAPS
500.0000 mg | ORAL_CAPSULE | Freq: Two times a day (BID) | ORAL | 0 refills | Status: AC
  Filled 2023-12-15 – 2023-12-16 (×3): qty 14, 7d supply, fill #0

## 2023-12-15 NOTE — Discharge Instructions (Signed)
 Treating you for a urine tract infection.  Take the antibiotics as prescribed.  Make sure you are drinking plenty of fluids.  Recommend cranberry over-the-counter We have cultured the urine and will call with any changes in the medication

## 2023-12-15 NOTE — ED Triage Notes (Signed)
 Seen last night for urinary frequency. Burning during urination. Brought in specimen today and requested re-evaluation.

## 2023-12-16 ENCOUNTER — Other Ambulatory Visit (HOSPITAL_COMMUNITY): Payer: Self-pay

## 2023-12-16 ENCOUNTER — Other Ambulatory Visit: Payer: Self-pay

## 2023-12-16 NOTE — ED Provider Notes (Signed)
 [facility]    CSN: [id number] Arrival date & time: 12/15/23  1824      History   Chief Complaint Chief Complaint  Patient presents with   Dysuria    HPI Karen Wong is a 89 y.o. female.   89 year old female presents today with dysuria.  She is here with her son.  Has had urinary frequency also associated.  Was seen here yesterday and unable to provide urine sample. Back today with urine specimen   Dysuria   Past Medical History:  Diagnosis Date   Anxiety    Arthritis    Basal cell carcinoma of face    Cataracts, bilateral    Hx: of "small"   Complication of anesthesia    Depression    Diabetes mellitus without complication (HCC)    Borderline diabetic; no medications   Goiter    Hx: of   Hypertension    Hypothyroidism    Pneumonia    Polymyalgia (HCC) January 2015   shoulders, elbows, wrists, hands   PONV (postoperative nausea and vomiting)    "Not in a long time"    Patient Active Problem List   Diagnosis Date Noted   Syncope and collapse 09/03/2019   Acquired hypothyroidism 11/17/2015   Chronic kidney disease, stage III (moderate) (HCC) 11/17/2015   Depression, major, recurrent, moderate (HCC) 11/17/2015   Diabetes type 2, controlled (HCC) 11/17/2015   Essential hypertension 11/17/2015   Pure hypercholesterolemia 11/17/2015   Spinal stenosis of lumbar region 08/09/2013    Past Surgical History:  Procedure Laterality Date   ABDOMINAL HYSTERECTOMY     APPENDECTOMY     COLONOSCOPY W/ BIOPSIES AND POLYPECTOMY     Hx; of   LUMBAR LAMINECTOMY N/A 08/09/2013   Procedure: Left L5-S1 resection of synovial cyst, decompression left L4-5 lateral recess;  Surgeon: Kerrin Champagne, MD;  Location: [facility] OR;  Service: Orthopedics;  Laterality: N/A;   SUBMANDIBULAR GLAND EXCISION Left 06/13/2015   Procedure: EXCISION SUBMANDIBULAR GLAND, excision stone;  Surgeon: Drema Halon, MD;  Location: [facility];  Service: ENT;  Laterality:  Left;   TONSILLECTOMY      OB History   No obstetric history on file.      Home Medications    Prior to Admission medications   Medication Sig Start Date End Date Taking? Authorizing Provider  acetaminophen (TYLENOL) 325 MG tablet Take 325 mg by mouth every 6 (six) hours as needed.     [provider]  Camphor 3.1 % CREA Apply topically.    [provider]  cephALEXin (KEFLEX) 500 MG capsule Take 1 capsule (500 mg total) by mouth 2 (two) times daily for 7 days. 12/15/23 12/23/23  Dahlia Byes A, FNP  enalapril (VASOTEC) 10 MG tablet Take 0.5 tablets (5 mg total) by mouth daily. 09/03/19   Baldo Daub, MD  Levothyroxine Sodium 25 MCG CAPS Take 25 mcg by mouth daily before breakfast.    [provider]  Multiple Vitamins-Minerals (PRESERVISION AREDS 2 PO) Take 2 capsules by mouth daily.    [provider]  sertraline (ZOLOFT) 100 MG tablet Take 100 mg by mouth daily.    [provider]  TIROSINT 25 MCG CAPS Take 1 capsule (25 mcg total) by mouth daily before breakfast on an empty stomach 02/14/23       Family History Family History  Problem Relation Age of Onset   Heart disease Mother    Hypertension Mother    Heart  disease Father    Hypertension Father    Heart disease Brother    Sarcoidosis Brother     Social History Social History   Tobacco Use   Smoking status: Never   Smokeless tobacco: Never  Vaping Use   Vaping status: Never Used  Substance Use Topics   Alcohol use: No   Drug use: No     Allergies   Denosumab, Ezetimibe, Penicillins, Prednisone, Statins, Asa [aspirin], and Demerol [meperidine]   Review of Systems Review of Systems  Genitourinary:  Positive for dysuria.     Physical Exam Triage Vital Signs ED Triage Vitals  Encounter Vitals Group     BP 12/15/23 1843 (!) 160/84     Systolic BP Percentile --      Diastolic BP Percentile --      Pulse Rate 12/15/23 1843 62     Resp 12/15/23 1843 20      Temp 12/15/23 1843 97.8 F (36.6 C)     Temp Source 12/15/23 1843 Oral     SpO2 12/15/23 1843 96 %     Weight --      Height --      Head Circumference --      Peak Flow --      Pain Score 12/15/23 1844 2     Pain Loc --      Pain Education --      Exclude from Growth Chart --    No data found.  Updated Vital Signs BP (!) 160/84 (BP Location: Right Arm)   Pulse 62   Temp 97.8 F (36.6 C) (Oral)   Resp 20   SpO2 96%   Visual Acuity Right Eye Distance:   Left Eye Distance:   Bilateral Distance:    Right Eye Near:   Left Eye Near:    Bilateral Near:     Physical Exam Constitutional:      General: She is not in acute distress.    Appearance: She is not ill-appearing or toxic-appearing.  Pulmonary:     Effort: Pulmonary effort is normal.  Skin:    General: Skin is warm and dry.  Neurological:     Mental Status: She is alert. Mental status is at baseline.      UC Treatments / Results  Labs (all labs ordered are listed, but only abnormal results are displayed) Labs Reviewed  POCT URINALYSIS DIP (MANUAL ENTRY) - Abnormal; Notable for the following components:      Result Value   Color, UA straw (*)    Clarity, UA turbid (*)    Blood, UA moderate (*)    Nitrite, UA Positive (*)    Leukocytes, UA Small (1+) (*)    All other components within normal limits  URINE CULTURE    EKG   Radiology No results found.  Procedures Procedures (including critical care time)  Medications Ordered in UC Medications - No data to display  Initial Impression / Assessment and Plan / UC Course  I have reviewed the triage vital signs and the nursing notes.  Pertinent labs & imaging results that were available during my care of the patient were reviewed by me and considered in my medical decision making (see chart for details).     Dysuria-urine positive for infection.  Treating with antibiotics.  Sending for culture. Keflex as prescribed and recommended cranberry juice  or capsules over-the-counter. Follow-up as needed Final Clinical Impressions(s) / UC Diagnoses   Final diagnoses:  Dysuria  Discharge Instructions      Treating you for a urine tract infection.  Take the antibiotics as prescribed.  Make sure you are drinking plenty of fluids.  Recommend cranberry over-the-counter We have cultured the urine and will call with any changes in the medication    ED Prescriptions     Medication Sig Dispense Auth. Provider   cephALEXin (KEFLEX) 500 MG capsule  (Status: Discontinued) Take 1 capsule (500 mg total) by mouth 2 (two) times daily for 7 days. 14 capsule Avrom Robarts A, FNP   cephALEXin (KEFLEX) 500 MG capsule Take 1 capsule (500 mg total) by mouth 2 (two) times daily for 7 days. 14 capsule Dahlia Byes A, FNP      PDMP not reviewed this encounter.   Janace Aris, FNP 12/16/23 1248

## 2023-12-17 LAB — URINE CULTURE: Culture: >=100000 — AB

## 2024-01-29 ENCOUNTER — Ambulatory Visit (HOSPITAL_BASED_OUTPATIENT_CLINIC_OR_DEPARTMENT_OTHER): Payer: Self-pay

## 2024-01-29 ENCOUNTER — Encounter (HOSPITAL_BASED_OUTPATIENT_CLINIC_OR_DEPARTMENT_OTHER): Payer: Self-pay | Admitting: Emergency Medicine

## 2024-01-29 ENCOUNTER — Ambulatory Visit (HOSPITAL_BASED_OUTPATIENT_CLINIC_OR_DEPARTMENT_OTHER)
Admission: EM | Admit: 2024-01-29 | Discharge: 2024-01-29 | Disposition: A | Discharge status: Home / Self Care | Attending: Family Medicine | Admitting: Family Medicine

## 2024-01-29 DIAGNOSIS — R350 Frequency of micturition: Secondary | ICD-10-CM | POA: Insufficient documentation

## 2024-01-29 DIAGNOSIS — N3001 Acute cystitis with hematuria: Secondary | ICD-10-CM | POA: Insufficient documentation

## 2024-01-29 LAB — POCT URINALYSIS DIP (MANUAL ENTRY)
Bilirubin, UA: NEGATIVE
Glucose, UA: NEGATIVE mg/dL
Ketones, POC UA: NEGATIVE mg/dL
Nitrite, UA: NEGATIVE
Protein Ur, POC: 100 mg/dL — AB
Spec Grav, UA: >=1.03 — AB
Urobilinogen, UA: 0.2 U/dL
pH, UA: 5.5

## 2024-01-29 MED ORDER — NITROFURANTOIN MONOHYD MACRO 100 MG PO CAPS: 100.0000 mg | ORAL_CAPSULE | Freq: Two times a day (BID) | ORAL | 0 refills | Status: AC

## 2024-01-29 NOTE — ED Triage Notes (Signed)
 Patient presents with c/o urinary frequency x 4 days.

## 2024-01-29 NOTE — ED Provider Notes (Signed)
 [facility]    CSN: [id number] Arrival date & time: 01/29/24  1651      History   Chief Complaint Chief Complaint  Patient presents with   Urinary Frequency    HPI Karen Wong is a 89 y.o. female.   Patient is here with her son.  She is profoundly hard of hearing and not able to understand me even with me yelling.  She can read lips some so I had to remove my mask during her visit.  Son is reporting that she is having urinary urgency for 4 days.  This is a correction of the triage note.  They were a little confused on which symptoms they were trying to communicate.  Basically she is not urinating more often but she is urinating more urgently and wetting herself before she gets to the bathroom.  She denies pain with urination and she denies abdominal pain.  No diarrhea recently but she did have some diarrhea in February after using the antibiotics.  Her diarrhea was mild and went away on its own.   Urinary Frequency Pertinent negatives include no chest pain, no abdominal pain and no shortness of breath.    Past Medical History:  Diagnosis Date   Anxiety    Arthritis    Basal cell carcinoma of face    Cataracts, bilateral    Hx: of "small"   Complication of anesthesia    Depression    Diabetes mellitus without complication (HCC)    Borderline diabetic; no medications   Goiter    Hx: of   Hypertension    Hypothyroidism    Pneumonia    Polymyalgia (HCC) January 2015   shoulders, elbows, wrists, hands   PONV (postoperative nausea and vomiting)    "Not in a long time"    Patient Active Problem List   Diagnosis Date Noted   Syncope and collapse 09/03/2019   Acquired hypothyroidism 11/17/2015   Chronic kidney disease, stage III (moderate) (HCC) 11/17/2015   Depression, major, recurrent, moderate (HCC) 11/17/2015   Diabetes type 2, controlled (HCC) 11/17/2015   Essential hypertension 11/17/2015   Pure hypercholesterolemia 11/17/2015   Spinal stenosis of  lumbar region 08/09/2013    Past Surgical History:  Procedure Laterality Date   ABDOMINAL HYSTERECTOMY     APPENDECTOMY     COLONOSCOPY W/ BIOPSIES AND POLYPECTOMY     Hx; of   LUMBAR LAMINECTOMY N/A 08/09/2013   Procedure: Left L5-S1 resection of synovial cyst, decompression left L4-5 lateral recess;  Surgeon: Alphonso Jean, MD;  Location: [facility];  Service: Orthopedics;  Laterality: N/A;   SUBMANDIBULAR GLAND EXCISION Left 06/13/2015   Procedure: EXCISION SUBMANDIBULAR GLAND, excision stone;  Surgeon: Prescott Brodie, MD;  Location: [facility];  Service: ENT;  Laterality: Left;   TONSILLECTOMY      OB History   No obstetric history on file.      Home Medications    Prior to Admission medications   Medication Sig Start Date End Date Taking? Authorizing Provider  cholecalciferol (VITAMIN D3) 25 MCG (1000 UNIT) tablet Take 1,000 Units by mouth. 11/24/18  Yes [provider]  Cyanocobalamin 2500 MCG TABS Take by mouth. 12/24/16  Yes [provider]  nitrofurantoin, macrocrystal-monohydrate, (MACROBID) 100 MG capsule Take 1 capsule (100 mg total) by mouth 2 (two) times daily for 7 days. 01/29/24 02/05/24 Yes Guss Legacy, FNP  acetaminophen  (TYLENOL ) 325 MG tablet Take 325 mg by mouth every 6 (six) hours as  needed.     [provider]  Camphor 3.1 % CREA Apply topically.    [provider]  enalapril  (VASOTEC ) 10 MG tablet Take 0.5 tablets (5 mg total) by mouth daily. 09/03/19   Hassan Links, MD  Levothyroxine  Sodium 25 MCG CAPS Take 25 mcg by mouth daily before breakfast.    [provider]  Multiple Vitamins-Minerals (PRESERVISION AREDS 2 PO) Take 2 capsules by mouth daily.    [provider]  sertraline  (ZOLOFT ) 100 MG tablet Take 100 mg by mouth daily.    [provider]  TIROSINT  25 MCG CAPS Take 1 capsule (25 mcg total) by mouth daily before breakfast on an empty stomach 02/14/23       Family  History Family History  Problem Relation Age of Onset   Heart disease Mother    Hypertension Mother    Heart disease Father    Hypertension Father    Heart disease Brother    Sarcoidosis Brother     Social History Social History   Tobacco Use   Smoking status: Never   Smokeless tobacco: Never  Vaping Use   Vaping status: Never Used  Substance Use Topics   Alcohol use: No   Drug use: No     Allergies   Denosumab , Ezetimibe, Penicillins, Prednisone, Statins, Asa [aspirin], and Demerol [meperidine]   Review of Systems Review of Systems  Constitutional:  Negative for chills and fever.  HENT:  Positive for hearing loss (profound). Negative for ear pain and sore throat.   Eyes:  Negative for pain and visual disturbance.  Respiratory:  Negative for cough and shortness of breath.   Cardiovascular:  Negative for chest pain and palpitations.  Gastrointestinal:  Negative for abdominal pain, constipation, diarrhea, nausea and vomiting.  Genitourinary:  Positive for urgency. Negative for dysuria and hematuria.  Musculoskeletal:  Negative for arthralgias and back pain.  Skin:  Negative for color change and rash.  Neurological:  Negative for seizures and syncope.  All other systems reviewed and are negative.    Physical Exam Triage Vital Signs ED Triage Vitals  Encounter Vitals Group     BP 01/29/24 1723 (!) 169/69     Systolic BP Percentile --      Diastolic BP Percentile --      Pulse Rate 01/29/24 1723 68     Resp 01/29/24 1723 16     Temp 01/29/24 1723 98 F (36.7 C)     Temp Source 01/29/24 1723 Oral     SpO2 01/29/24 1723 96 %     Weight --      Height --      Head Circumference --      Peak Flow --      Pain Score 01/29/24 1721 0     Pain Loc --      Pain Education --      Exclude from Growth Chart --    No data found.  Updated Vital Signs BP (!) 169/69 (BP Location: Right Arm)   Pulse 68   Temp 98 F (36.7 C) (Oral)   Resp 16   SpO2 96%   Visual  Acuity Right Eye Distance:   Left Eye Distance:   Bilateral Distance:    Right Eye Near:   Left Eye Near:    Bilateral Near:     Physical Exam Vitals and nursing note reviewed.  Constitutional:      General: She is not in acute distress.  Appearance: She is well-developed. She is not ill-appearing or toxic-appearing.  HENT:     Head: Normocephalic and atraumatic.     Right Ear: External ear normal. Decreased hearing (Profound hearing loss) noted.     Left Ear: External ear normal. Decreased hearing (Profound hearing loss) noted.     Nose: Nose normal.     Mouth/Throat:     Lips: Pink.     Mouth: Mucous membranes are moist.  Eyes:     Conjunctiva/sclera: Conjunctivae normal.     Pupils: Pupils are equal, round, and reactive to light.  Cardiovascular:     Rate and Rhythm: Normal rate and regular rhythm.     Heart sounds: S1 normal and S2 normal. No murmur heard. Pulmonary:     Effort: Pulmonary effort is normal. No respiratory distress.     Breath sounds: Normal breath sounds. No decreased breath sounds, wheezing, rhonchi or rales.  Abdominal:     General: Bowel sounds are normal.     Palpations: Abdomen is soft.     Tenderness: There is no abdominal tenderness. There is no right CVA tenderness, left CVA tenderness, guarding or rebound. Negative signs include Murphy's sign, Rovsing's sign and McBurney's sign.  Musculoskeletal:        General: No swelling.     Cervical back: Neck supple.  Skin:    General: Skin is warm and dry.     Capillary Refill: Capillary refill takes less than 2 seconds.     Findings: No rash.  Neurological:     Mental Status: She is alert and oriented to person, place, and time.  Psychiatric:        Mood and Affect: Mood normal.      UC Treatments / Results  Labs (all labs ordered are listed, but only abnormal results are displayed) Labs Reviewed  POCT URINALYSIS DIP (MANUAL ENTRY) - Abnormal; Notable for the following components:       Result Value   Spec Grav, UA >=1.030 (*)    Blood, UA moderate (*)    Protein Ur, POC =100 (*)    Leukocytes, UA Small (1+) (*)    All other components within normal limits  URINE CULTURE    EKG   Radiology No results found.  Procedures Procedures (including critical care time)  Medications Ordered in UC Medications - No data to display  Initial Impression / Assessment and Plan / UC Course  I have reviewed the triage vital signs and the nursing notes.  Pertinent labs & imaging results that were available during my care of the patient were reviewed by me and considered in my medical decision making (see chart for details).     Plan of Care: Acute UTI: Urinalysis abnormal.  Urine culture sent.  Will adjust the plan of care if needed once the culture results.  See below for specifics.  Nitrofurantoin 100 mg 1 pill twice daily for 7 days.  Get plenty of fluids and rest. UTI prevention: Use D-Mannose, 500 mg pills, 1 pill twice daily for UTI prevention.  This is an over-the-counter product.  Follow-up if symptoms do not improve, worsen or new symptoms occur.  If UTIs recur will need to see primary care and get a referral to urology. Final Clinical Impressions(s) / UC Diagnoses   Final diagnoses:  Urinary frequency  Acute cystitis with hematuria     Discharge Instructions      Acute UTI: Urinalysis this is abnormal.  Urine culture collected and sent.  Will  adjust the plan of care, if needed once the urine culture results.  If urine culture is negative patient will be called and advised to stop the antibiotic.  If the urine culture is abnormal but resistant to the antibiotic she will be called and switched to a new antibiotic.  Will treat her with nitrofurantoin, 100 mg, twice daily for 7 days.  Get plenty of fluids and rest.  UTI Prevention: Educated the son and provided information about D-Mannose, 500 mg pills, 1 pill twice daily for UTI prevention.  These are  over-the-counter supplements and can be purchased at [place], [place], [facility].  Follow-up if symptoms do not improve, worsen or new symptoms occur.  Patient may need to see urology if she continues with chronic UTIs.   ED Prescriptions     Medication Sig Dispense Auth. Provider   nitrofurantoin, macrocrystal-monohydrate, (MACROBID) 100 MG capsule Take 1 capsule (100 mg total) by mouth 2 (two) times daily for 7 days. 14 capsule Guss Legacy, FNP      PDMP not reviewed this encounter.   Guss Legacy, FNP 01/29/24 [id number]

## 2024-01-29 NOTE — Discharge Instructions (Addendum)
 Acute UTI: Urinalysis this is abnormal.  Urine culture collected and sent.  Will adjust the plan of care, if needed once the urine culture results.  If urine culture is negative patient will be called and advised to stop the antibiotic.  If the urine culture is abnormal but resistant to the antibiotic she will be called and switched to a new antibiotic.  Will treat her with nitrofurantoin, 100 mg, twice daily for 7 days.  Get plenty of fluids and rest.  UTI Prevention: Educated the son and provided information about D-Mannose, 500 mg pills, 1 pill twice daily for UTI prevention.  These are over-the-counter supplements and can be purchased at [place], [place], [facility].  Follow-up if symptoms do not improve, worsen or new symptoms occur.  Patient may need to see urology if she continues with chronic UTIs.

## 2024-01-31 LAB — URINE CULTURE: Culture: >=100000 — AB

## 2024-02-03 ENCOUNTER — Ambulatory Visit: Payer: Self-pay

## 2024-02-13 ENCOUNTER — Ambulatory Visit (HOSPITAL_BASED_OUTPATIENT_CLINIC_OR_DEPARTMENT_OTHER)
Admission: EM | Admit: 2024-02-13 | Discharge: 2024-02-13 | Disposition: A | Discharge status: Home / Self Care | Attending: Family Medicine | Admitting: Family Medicine

## 2024-02-13 ENCOUNTER — Encounter (HOSPITAL_BASED_OUTPATIENT_CLINIC_OR_DEPARTMENT_OTHER): Payer: Self-pay

## 2024-02-13 DIAGNOSIS — T7840XA Allergy, unspecified, initial encounter: Secondary | ICD-10-CM | POA: Diagnosis not present

## 2024-02-13 MED ORDER — TRIAMCINOLONE ACETONIDE 40 MG/ML IJ SUSP
40.0000 mg | Freq: Once | INTRAMUSCULAR | Status: AC
  Administered 2024-02-13: 40 mg via INTRAMUSCULAR

## 2024-02-13 NOTE — Discharge Instructions (Signed)
 I would allergic reaction.  Kenalog  shot given here for swelling. Recommend cool compresses to the face and antihistamine like Zyrtec. Please follow-up for any continued or worsening issues

## 2024-02-13 NOTE — ED Triage Notes (Signed)
 Presents for facial swelling, itching. States small amount of itching under left eye yesterday and then swelling began today. States only new medicine is probiotic x 12 days.  Has taken this before.Denies SOB. "Just itching". Using clobetasol propionate cream and states didn't help.

## 2024-02-14 NOTE — ED Provider Notes (Signed)
 [facility]    CSN: [id number] Arrival date & time: 02/13/24  1856      History   Chief Complaint Chief Complaint  Patient presents with   Facial Swelling    HPI Karen Wong is a 89 y.o. female.   Patient is a 89 year old female who presents today with facial swelling and itching.  Started yesterday as a small amount of swelling and itching under the left eye but has progressed to the entire face.  Denies any trouble swallowing or breathing.  Denies any known allergies to foods.  Per son no new soaps or other products.  Unknown if was bitten by any insects but has not noticed any insect bites.     Past Medical History:  Diagnosis Date   Anxiety    Arthritis    Basal cell carcinoma of face    Cataracts, bilateral    Hx: of "small"   Complication of anesthesia    Depression    Diabetes mellitus without complication (HCC)    Borderline diabetic; no medications   Goiter    Hx: of   Hypertension    Hypothyroidism    Pneumonia    Polymyalgia (HCC) January 2015   shoulders, elbows, wrists, hands   PONV (postoperative nausea and vomiting)    "Not in a long time"    Patient Active Problem List   Diagnosis Date Noted   Syncope and collapse 09/03/2019   Acquired hypothyroidism 11/17/2015   Chronic kidney disease, stage III (moderate) (HCC) 11/17/2015   Depression, major, recurrent, moderate (HCC) 11/17/2015   Diabetes type 2, controlled (HCC) 11/17/2015   Essential hypertension 11/17/2015   Pure hypercholesterolemia 11/17/2015   Spinal stenosis of lumbar region 08/09/2013    Past Surgical History:  Procedure Laterality Date   ABDOMINAL HYSTERECTOMY     APPENDECTOMY     COLONOSCOPY W/ BIOPSIES AND POLYPECTOMY     Hx; of   LUMBAR LAMINECTOMY N/A 08/09/2013   Procedure: Left L5-S1 resection of synovial cyst, decompression left L4-5 lateral recess;  Surgeon: Alphonso Jean, MD;  Location: [facility];  Service: Orthopedics;  Laterality: N/A;   SUBMANDIBULAR  GLAND EXCISION Left 06/13/2015   Procedure: EXCISION SUBMANDIBULAR GLAND, excision stone;  Surgeon: Prescott Brodie, MD;  Location: [facility];  Service: ENT;  Laterality: Left;   TONSILLECTOMY      OB History   No obstetric history on file.      Home Medications    Prior to Admission medications   Medication Sig Start Date End Date Taking? Authorizing Provider  acetaminophen  (TYLENOL ) 325 MG tablet Take 325 mg by mouth every 6 (six) hours as needed.     [provider]  Camphor 3.1 % CREA Apply topically.    [provider]  cholecalciferol (VITAMIN D3) 25 MCG (1000 UNIT) tablet Take 1,000 Units by mouth. 11/24/18   [provider]  Cyanocobalamin 2500 MCG TABS Take by mouth. 12/24/16   [provider]  enalapril  (VASOTEC ) 10 MG tablet Take 0.5 tablets (5 mg total) by mouth daily. 09/03/19   Hassan Links, MD  Levothyroxine  Sodium 25 MCG CAPS Take 25 mcg by mouth daily before breakfast.    [provider]  Multiple Vitamins-Minerals (PRESERVISION AREDS 2 PO) Take 2 capsules by mouth daily.    [provider]  sertraline  (ZOLOFT ) 100 MG tablet Take 100 mg by mouth daily.    [provider]  TIROSINT  25 MCG CAPS Take 1 capsule (  25 mcg total) by mouth daily before breakfast on an empty stomach 02/14/23       Family History Family History  Problem Relation Age of Onset   Heart disease Mother    Hypertension Mother    Heart disease Father    Hypertension Father    Heart disease Brother    Sarcoidosis Brother     Social History Social History   Tobacco Use   Smoking status: Never   Smokeless tobacco: Never  Vaping Use   Vaping status: Never Used  Substance Use Topics   Alcohol use: No   Drug use: No     Allergies   Denosumab , Ezetimibe, Penicillins, Prednisone, Statins, Asa [aspirin], and Demerol [meperidine]   Review of Systems Review of Systems See HPI  Physical Exam Triage Vital  Signs ED Triage Vitals  Encounter Vitals Group     BP 02/13/24 1909 (!) 152/71     Systolic BP Percentile --      Diastolic BP Percentile --      Pulse --      Resp 02/13/24 1909 20     Temp 02/13/24 1909 98.4 F (36.9 C)     Temp Source 02/13/24 1909 Karen     SpO2 02/13/24 1909 93 %     Weight --      Height --      Head Circumference --      Peak Flow --      Pain Score 02/13/24 1911 0     Pain Loc --      Pain Education --      Exclude from Growth Chart --    No data found.  Updated Vital Signs BP (!) 152/71 (BP Location: Right Arm)   Temp 98.4 F (36.9 C) (Karen)   Resp 20   SpO2 93%   Visual Acuity Right Eye Distance:   Left Eye Distance:   Bilateral Distance:    Right Eye Near:   Left Eye Near:    Bilateral Near:     Physical Exam HENT:     Head:     Comments: Generalized swelling and erythema to entire face, worse under eye and cheeks. No tender to palpation of the face and periorbital area.     Mouth/Throat:     Pharynx: Oropharynx is clear.     Comments: Mild lip swelling.  Neurological:     Mental Status: She is alert.      UC Treatments / Results  Labs (all labs ordered are listed, but only abnormal results are displayed) Labs Reviewed - No data to display  EKG   Radiology No results found.  Procedures Procedures (including critical care time)  Medications Ordered in UC Medications  triamcinolone  acetonide (KENALOG -40) injection 40 mg (40 mg Intramuscular Given 02/13/24 1934)    Initial Impression / Assessment and Plan / UC Course  I have reviewed the triage vital signs and the nursing notes.  Pertinent labs & imaging results that were available during my care of the patient were reviewed by me and considered in my medical decision making (see chart for details).     Allergic reaction-most likely patient is having some sort of allergic reaction to unknown substance or potentially was bitten by an insect.  She is not having any  pain and there is no concerns for infection at this time. We will treat this potential allergic reaction with a Kenalog  shot here today.  Recommend cool compresses to the face and antihistamine  like Zyrtec daily Return precautions given if symptoms worsen or do not resolve. Final Clinical Impressions(s) / UC Diagnoses   Final diagnoses:  Allergic reaction, initial encounter     Discharge Instructions      I would allergic reaction.  Kenalog  shot given here for swelling. Recommend cool compresses to the face and antihistamine like Zyrtec. Please follow-up for any continued or worsening issues  ED Prescriptions   None    PDMP not reviewed this encounter.   Landa Pine, FNP 02/14/24 1511

## 2024-03-22 ENCOUNTER — Other Ambulatory Visit: Payer: Self-pay

## 2024-03-22 ENCOUNTER — Other Ambulatory Visit (HOSPITAL_COMMUNITY): Payer: Self-pay

## 2024-03-22 MED ORDER — TIROSINT 25 MCG PO CAPS
25.0000 ug | ORAL_CAPSULE | Freq: Every day | ORAL | 4 refills | Status: AC
  Filled 2024-03-22: qty 90, 90d supply, fill #0
  Filled 2024-06-16: qty 90, 90d supply, fill #1
  Filled 2024-09-12: qty 90, 90d supply, fill #2

## 2024-03-23 ENCOUNTER — Other Ambulatory Visit: Payer: Self-pay

## 2024-03-24 ENCOUNTER — Other Ambulatory Visit (HOSPITAL_COMMUNITY): Payer: Self-pay

## 2024-06-07 ENCOUNTER — Ambulatory Visit (INDEPENDENT_AMBULATORY_CARE_PROVIDER_SITE_OTHER): Admitting: Family Medicine

## 2024-06-07 ENCOUNTER — Encounter (HOSPITAL_BASED_OUTPATIENT_CLINIC_OR_DEPARTMENT_OTHER): Payer: Self-pay | Admitting: Family Medicine

## 2024-06-07 VITALS — BP 136/74 | HR 72 | Temp 97.9°F | Resp 16 | Ht 58.66 in | Wt 100.0 lb

## 2024-06-07 DIAGNOSIS — E559 Vitamin D deficiency, unspecified: Secondary | ICD-10-CM

## 2024-06-07 DIAGNOSIS — I10 Essential (primary) hypertension: Secondary | ICD-10-CM | POA: Diagnosis not present

## 2024-06-07 DIAGNOSIS — E039 Hypothyroidism, unspecified: Secondary | ICD-10-CM

## 2024-06-07 DIAGNOSIS — M810 Age-related osteoporosis without current pathological fracture: Secondary | ICD-10-CM | POA: Insufficient documentation

## 2024-06-07 DIAGNOSIS — Z862 Personal history of diseases of the blood and blood-forming organs and certain disorders involving the immune mechanism: Secondary | ICD-10-CM | POA: Insufficient documentation

## 2024-06-07 DIAGNOSIS — R5383 Other fatigue: Secondary | ICD-10-CM

## 2024-06-07 DIAGNOSIS — E7439 Other disorders of intestinal carbohydrate absorption: Secondary | ICD-10-CM

## 2024-06-07 NOTE — Progress Notes (Unsigned)
 Established Patient Office Visit  Subjective   Patient ID: Karen Wong, female    DOB: 11-27-1933  Age: 89 y.o. MRN: [id number]  Chief Complaint  Patient presents with   Establish Care    Establish care     F/u as above.  Known to our urgent care, but new to my practice.  Previously f/by Dr. Thurmond.  Overall stable and she has a supportive son with her today.  Extended discussion.    Past Medical History:  Diagnosis Date   CKD (chronic kidney disease) stage 3, GFR 30-59 ml/min (HCC)    Depression    Glucose intolerance    Hypertension    Hypothyroidism    f/by Dr. Faythe   Osteoarthritis    Osteoporosis    f/by Dr. Faythe of Endocrine at [facility]    Outpatient Encounter Medications as of 06/07/2024  Medication Sig   acetaminophen  (TYLENOL ) 325 MG tablet Take 325 mg by mouth every 6 (six) hours as needed.    Camphor 3.1 % CREA Apply topically.   cholecalciferol (VITAMIN D3) 25 MCG (1000 UNIT) tablet Take 1,000 Units by mouth.   Cyanocobalamin 2500 MCG TABS Take by mouth.   enalapril  (VASOTEC ) 10 MG tablet Take 0.5 tablets (5 mg total) by mouth daily.   Levothyroxine  Sodium 25 MCG CAPS Take 25 mcg by mouth daily before breakfast.   Multiple Vitamins-Minerals (PRESERVISION AREDS 2 PO) Take 2 capsules by mouth daily.   sertraline  (ZOLOFT ) 100 MG tablet Take 100 mg by mouth daily.   TIROSINT  25 MCG CAPS Take 1 capsule (25 mcg total) by mouth daily before breakfast on an empty stomach   No facility-administered encounter medications on file as of 06/07/2024.    Social History   Tobacco Use   Smoking status: Never   Smokeless tobacco: Never  Vaping Use   Vaping status: Never Used  Substance Use Topics   Alcohol use: No   Drug use: Never      Review of Systems  Constitutional:  Negative for diaphoresis, fever, malaise/fatigue and weight loss.  Respiratory:  Negative for cough, shortness of breath and wheezing.   Cardiovascular:  Negative for chest pain,  palpitations, orthopnea, claudication, leg swelling and PND.      Objective:     BP 136/74 (BP Location: Left Arm, Patient Position: Standing, Cuff Size: Normal)   Pulse 72   Temp 97.9 F (36.6 C) (Oral)   Resp 16   Ht 4' 10.66 (1.49 m)   Wt 100 lb (45.4 kg)   SpO2 94%   BMI 20.43 kg/m    Physical Exam Constitutional:      General: She is not in acute distress.    Appearance: Normal appearance.     Comments: Pleasant, a bit frail.  HENT:     Head: Normocephalic.  Neck:     Vascular: No carotid bruit.  Cardiovascular:     Rate and Rhythm: Normal rate and regular rhythm.     Pulses: Normal pulses.     Heart sounds: Normal heart sounds.  Pulmonary:     Effort: Pulmonary effort is normal.     Breath sounds: Normal breath sounds.  Abdominal:     General: Bowel sounds are normal.     Palpations: Abdomen is soft.  Musculoskeletal:     Cervical back: Neck supple. No tenderness.     Right lower leg: No edema.     Left lower leg: No edema.  Neurological:     Mental  Status: She is alert.      Results for orders placed or performed in visit on 06/07/24  Comprehensive metabolic panel with GFR  Result Value Ref Range   Glucose 126 (H) 70 - 99 mg/dL   BUN 34 (H) 8 - 27 mg/dL   Creatinine, Ser 8.63 (H) 0.57 - 1.00 mg/dL   eGFR 38 (L) >40 fO/fpw/8.26   BUN/Creatinine Ratio 25 12 - 28   Sodium 140 134 - 144 mmol/L   Potassium 4.4 3.5 - 5.2 mmol/L   Chloride 101 96 - 106 mmol/L   CO2 26 20 - 29 mmol/L   Calcium 9.4 8.7 - 10.3 mg/dL   Total Protein 6.4 6.0 - 8.5 g/dL   Albumin 4.5 3.7 - 4.7 g/dL   Globulin, Total 1.9 1.5 - 4.5 g/dL   Bilirubin Total 0.4 0.0 - 1.2 mg/dL   Alkaline Phosphatase 100 48 - 129 IU/L   AST 19 0 - 40 IU/L   ALT 12 0 - 32 IU/L  CBC with Differential/Platelet  Result Value Ref Range   WBC 5.6 3.4 - 10.8 x10E3/uL   RBC 3.39 (L) 3.77 - 5.28 x10E6/uL   Hemoglobin 11.1 11.1 - 15.9 g/dL   Hematocrit 66.6 (L) 65.9 - 46.6 %   MCV 98 (H) 79 - 97  fL   MCH 32.7 26.6 - 33.0 pg   MCHC 33.3 31.5 - 35.7 g/dL   RDW 87.2 88.2 - 84.5 %   Platelets 204 150 - 450 x10E3/uL   Neutrophils 58 Not Estab. %   Lymphs 31 Not Estab. %   Monocytes 10 Not Estab. %   Eos 1 Not Estab. %   Basos 0 Not Estab. %   Neutrophils Absolute 3.3 1.4 - 7.0 x10E3/uL   Lymphocytes Absolute 1.7 0.7 - 3.1 x10E3/uL   Monocytes Absolute 0.5 0.1 - 0.9 x10E3/uL   EOS (ABSOLUTE) 0.0 0.0 - 0.4 x10E3/uL   Basophils Absolute 0.0 0.0 - 0.2 x10E3/uL   Immature Granulocytes 0 Not Estab. %   Immature Grans (Abs) 0.0 0.0 - 0.1 x10E3/uL  Hemoglobin A1c  Result Value Ref Range   Hgb A1c MFr Bld 5.7 (H) 4.8 - 5.6 %   Est. average glucose Bld gHb Est-mCnc 117 mg/dL  TSH  Result Value Ref Range   TSH 1.120 0.450 - 4.500 uIU/mL  Vitamin B12  Result Value Ref Range   Vitamin B-12 276 232 - 1,245 pg/mL  VITAMIN D  25 Hydroxy (Vit-D Deficiency, Fractures)  Result Value Ref Range   Vit D, 25-Hydroxy 26.0 (L) 30.0 - 100.0 ng/mL      The ASCVD Risk score (Arnett DK, et al., 2019) failed to calculate for the following reasons:   The 2019 ASCVD risk score is only valid for ages 74 to 25    Assessment & Plan:  Essential hypertension Assessment & Plan: Controlled.  Review old records and f/u soon with her labs.  Orders: -     Comprehensive metabolic panel with GFR -     CBC with Differential/Platelet  Osteoporosis without current pathological fracture, unspecified osteoporosis type Assessment & Plan: Request Endocrine records.   Acquired hypothyroidism Assessment & Plan: Await lab and f/u soon.  Safety proofing and fall precautions reviewed.  Orders: -     TSH  Glucose intolerance -     Hemoglobin A1c  Vitamin D  deficiency -     VITAMIN D  25 Hydroxy (Vit-D Deficiency, Fractures)  Fatigue, unspecified type -     Vitamin B12  Return in about 4 weeks (around 07/05/2024) for chronic follow-up.    REDDING PONCE NORLEEN FALCON., MD

## 2024-06-08 ENCOUNTER — Encounter (HOSPITAL_BASED_OUTPATIENT_CLINIC_OR_DEPARTMENT_OTHER): Payer: Self-pay | Admitting: Family Medicine

## 2024-06-08 LAB — VITAMIN B12: Vitamin B-12: 276 pg/mL (ref 232–1245)

## 2024-06-08 LAB — COMPREHENSIVE METABOLIC PANEL WITH GFR
ALT: 12 IU/L (ref 0–32)
AST: 19 IU/L (ref 0–40)
Albumin: 4.5 g/dL (ref 3.7–4.7)
Alkaline Phosphatase: 100 IU/L (ref 48–129)
BUN/Creatinine Ratio: 25 (ref 12–28)
BUN: 34 mg/dL — ABNORMAL HIGH (ref 8–27)
Bilirubin Total: 0.4 mg/dL (ref 0.0–1.2)
CO2: 26 mmol/L (ref 20–29)
Calcium: 9.4 mg/dL (ref 8.7–10.3)
Chloride: 101 mmol/L (ref 96–106)
Creatinine, Ser: 1.36 mg/dL — ABNORMAL HIGH (ref 0.57–1.00)
Globulin, Total: 1.9 g/dL (ref 1.5–4.5)
Glucose: 126 mg/dL — ABNORMAL HIGH (ref 70–99)
Potassium: 4.4 mmol/L (ref 3.5–5.2)
Sodium: 140 mmol/L (ref 134–144)
Total Protein: 6.4 g/dL (ref 6.0–8.5)
eGFR: 38 mL/min/1.73 — ABNORMAL LOW (ref >59)

## 2024-06-08 LAB — TSH: TSH: 1.12 u[IU]/mL (ref 0.450–4.500)

## 2024-06-08 LAB — CBC WITH DIFFERENTIAL/PLATELET
Basophils Absolute: 0 x10E3/uL (ref 0.0–0.2)
Basos: 0 %
EOS (ABSOLUTE): 0 x10E3/uL (ref 0.0–0.4)
Eos: 1 %
Hematocrit: 33.3 % — ABNORMAL LOW (ref 34.0–46.6)
Hemoglobin: 11.1 g/dL (ref 11.1–15.9)
Immature Grans (Abs): 0 x10E3/uL (ref 0.0–0.1)
Immature Granulocytes: 0 %
Lymphocytes Absolute: 1.7 x10E3/uL (ref 0.7–3.1)
Lymphs: 31 %
MCH: 32.7 pg (ref 26.6–33.0)
MCHC: 33.3 g/dL (ref 31.5–35.7)
MCV: 98 fL — ABNORMAL HIGH (ref 79–97)
Monocytes Absolute: 0.5 x10E3/uL (ref 0.1–0.9)
Monocytes: 10 %
Neutrophils Absolute: 3.3 x10E3/uL (ref 1.4–7.0)
Neutrophils: 58 %
Platelets: 204 x10E3/uL (ref 150–450)
RBC: 3.39 x10E6/uL — ABNORMAL LOW (ref 3.77–5.28)
RDW: 12.7 % (ref 11.7–15.4)
WBC: 5.6 x10E3/uL (ref 3.4–10.8)

## 2024-06-08 LAB — HEMOGLOBIN A1C
Est. average glucose Bld gHb Est-mCnc: 117 mg/dL
Hgb A1c MFr Bld: 5.7 % — ABNORMAL HIGH (ref 4.8–5.6)

## 2024-06-08 LAB — VITAMIN D 25 HYDROXY (VIT D DEFICIENCY, FRACTURES): Vit D, 25-Hydroxy: 26 ng/mL — ABNORMAL LOW (ref 30.0–100.0)

## 2024-06-08 NOTE — Assessment & Plan Note (Signed)
 Await lab and f/u soon.  Safety proofing and fall precautions reviewed.

## 2024-06-08 NOTE — Assessment & Plan Note (Signed)
 Request Endocrine records.

## 2024-06-08 NOTE — Assessment & Plan Note (Signed)
 Controlled.  Review old records and f/u soon with her labs.

## 2024-06-09 ENCOUNTER — Ambulatory Visit (HOSPITAL_BASED_OUTPATIENT_CLINIC_OR_DEPARTMENT_OTHER): Payer: Self-pay | Admitting: Family Medicine

## 2024-06-11 ENCOUNTER — Telehealth (HOSPITAL_BASED_OUTPATIENT_CLINIC_OR_DEPARTMENT_OTHER): Payer: Self-pay | Admitting: *Deleted

## 2024-06-11 NOTE — Telephone Encounter (Signed)
Pt. Was called

## 2024-06-16 ENCOUNTER — Other Ambulatory Visit: Payer: Self-pay

## 2024-06-17 ENCOUNTER — Other Ambulatory Visit (HOSPITAL_COMMUNITY): Payer: Self-pay

## 2024-07-02 ENCOUNTER — Ambulatory Visit (HOSPITAL_BASED_OUTPATIENT_CLINIC_OR_DEPARTMENT_OTHER): Admitting: *Deleted

## 2024-07-02 DIAGNOSIS — R5383 Other fatigue: Secondary | ICD-10-CM | POA: Diagnosis not present

## 2024-07-02 MED ORDER — CYANOCOBALAMIN 1000 MCG/ML IJ SOLN
1000.0000 ug | Freq: Once | INTRAMUSCULAR | Status: AC
  Administered 2024-07-02: 1000 ug via INTRAMUSCULAR

## 2024-07-02 NOTE — Progress Notes (Signed)
 Patient is in office today for a nurse visit for B12 Injection. Patient Injection was given in the  Right deltoid. Patient tolerated injection well.

## 2024-07-09 ENCOUNTER — Ambulatory Visit (INDEPENDENT_AMBULATORY_CARE_PROVIDER_SITE_OTHER): Admitting: *Deleted

## 2024-07-09 DIAGNOSIS — R5383 Other fatigue: Secondary | ICD-10-CM

## 2024-07-09 MED ORDER — CYANOCOBALAMIN 1000 MCG/ML IJ SOLN
1000.0000 ug | Freq: Once | INTRAMUSCULAR | Status: AC
  Administered 2024-07-09: 1000 ug via INTRAMUSCULAR

## 2024-07-09 NOTE — Progress Notes (Signed)
 Patient is in office today for a nurse visit for B12 Injection. Patient Injection was given in the  Left deltoid. Patient tolerated injection well.

## 2024-07-15 ENCOUNTER — Ambulatory Visit (INDEPENDENT_AMBULATORY_CARE_PROVIDER_SITE_OTHER)

## 2024-07-15 DIAGNOSIS — R5383 Other fatigue: Secondary | ICD-10-CM

## 2024-07-15 MED ORDER — CYANOCOBALAMIN 1000 MCG/ML IJ SOLN
1000.0000 ug | Freq: Once | INTRAMUSCULAR | Status: AC
  Administered 2024-07-15: 1000 ug via INTRAMUSCULAR

## 2024-07-15 NOTE — Progress Notes (Signed)
 Patient is in office today for a nurse visit for B12 Injection. Patient Injection was given in the  Right deltoid. Patient tolerated injection well.

## 2024-07-23 ENCOUNTER — Ambulatory Visit (INDEPENDENT_AMBULATORY_CARE_PROVIDER_SITE_OTHER): Admitting: Family Medicine

## 2024-07-23 DIAGNOSIS — D510 Vitamin B12 deficiency anemia due to intrinsic factor deficiency: Secondary | ICD-10-CM | POA: Diagnosis not present

## 2024-07-23 MED ORDER — CYANOCOBALAMIN 1000 MCG/ML IJ SOLN
1000.0000 ug | Freq: Once | INTRAMUSCULAR | Status: AC
  Administered 2024-07-23: 1000 ug via INTRAMUSCULAR

## 2024-07-23 NOTE — Progress Notes (Signed)
 Patient is in office today for a nurse visit for B12 Injection. Patient Injection was given in the  Right deltoid. Patient tolerated injection well.

## 2024-08-23 ENCOUNTER — Ambulatory Visit (HOSPITAL_BASED_OUTPATIENT_CLINIC_OR_DEPARTMENT_OTHER)

## 2024-08-27 ENCOUNTER — Ambulatory Visit (INDEPENDENT_AMBULATORY_CARE_PROVIDER_SITE_OTHER): Admitting: *Deleted

## 2024-08-27 DIAGNOSIS — D510 Vitamin B12 deficiency anemia due to intrinsic factor deficiency: Secondary | ICD-10-CM

## 2024-08-27 MED ORDER — CYANOCOBALAMIN 1000 MCG/ML IJ SOLN
1000.0000 ug | Freq: Once | INTRAMUSCULAR | Status: AC
  Administered 2024-08-27: 1000 ug via INTRAMUSCULAR

## 2024-08-27 NOTE — Progress Notes (Unsigned)
 Patient is in office today for a nurse visit for B12 Injection. Patient Injection was given in the  Right deltoid. Patient tolerated injection well.

## 2024-09-13 ENCOUNTER — Other Ambulatory Visit (HOSPITAL_COMMUNITY): Payer: Self-pay

## 2024-09-13 ENCOUNTER — Other Ambulatory Visit: Payer: Self-pay

## 2024-09-14 ENCOUNTER — Other Ambulatory Visit (HOSPITAL_COMMUNITY): Payer: Self-pay

## 2024-09-15 ENCOUNTER — Other Ambulatory Visit (HOSPITAL_COMMUNITY): Payer: Self-pay

## 2024-10-01 ENCOUNTER — Ambulatory Visit (INDEPENDENT_AMBULATORY_CARE_PROVIDER_SITE_OTHER)

## 2024-10-01 DIAGNOSIS — D510 Vitamin B12 deficiency anemia due to intrinsic factor deficiency: Secondary | ICD-10-CM | POA: Diagnosis not present

## 2024-10-01 MED ORDER — CYANOCOBALAMIN 1000 MCG/ML IJ SOLN
1000.0000 ug | Freq: Once | INTRAMUSCULAR | Status: AC
  Administered 2024-10-01: 1000 ug via INTRAMUSCULAR

## 2024-10-01 NOTE — Progress Notes (Unsigned)
 Patient is in office today for a nurse visit for B12 Injection. Patient Injection was given in the  Right deltoid. Patient tolerated injection well.

## 2024-10-04 ENCOUNTER — Ambulatory Visit (INDEPENDENT_AMBULATORY_CARE_PROVIDER_SITE_OTHER): Admit: 2024-10-04 | Discharge: 2024-10-04 | Disposition: A | Discharge status: Home / Self Care | Admitting: Radiology

## 2024-10-04 ENCOUNTER — Encounter (HOSPITAL_BASED_OUTPATIENT_CLINIC_OR_DEPARTMENT_OTHER): Payer: Self-pay

## 2024-10-04 ENCOUNTER — Ambulatory Visit (HOSPITAL_BASED_OUTPATIENT_CLINIC_OR_DEPARTMENT_OTHER)
Admission: EM | Admit: 2024-10-04 | Discharge: 2024-10-04 | Disposition: A | Discharge status: Home / Self Care | Attending: Family Medicine | Admitting: Family Medicine

## 2024-10-04 DIAGNOSIS — M545 Low back pain, unspecified: Secondary | ICD-10-CM | POA: Diagnosis not present

## 2024-10-04 NOTE — ED Provider Notes (Signed)
 " [facility]    CSN: [id number] Arrival date & time: 10/04/24  1846      History   Chief Complaint Chief Complaint  Patient presents with   Back Pain    HPI Karen Wong is a 89 y.o. female.   Patient is an 89 year old female who presents today with family.  Per family found her on the floor after a fall a few days ago.  Fall approx 3 days ago. Initially did not complain of any pain. Reports pain tonight. Family member unsure how fall occurred or how she fell whether forward or backward. No visible areas of discoloration to back. Patient lives alone. Patient is able to ambulate but does so very guarded. Unsure if due to pain or general apprehension.    Back Pain   Past Medical History:  Diagnosis Date   CKD (chronic kidney disease) stage 3, GFR 30-59 ml/min (HCC)    Depression    Glucose intolerance    Hypertension    Hypothyroidism    f/by Dr. Faythe   Osteoarthritis    Osteoporosis    f/[facility] Dr. F[facility]the of Endocrine at [facility]    Patient Active Problem List   Diagnosis Date Noted   History of iron deficiency anemia 06/07/2024   Osteoporosis 06/07/2024   Double vision 01/18/2021   Iron deficiency anemia 01/15/2021   Polymyalgia 01/15/2021   Syncope and collapse 09/03/2019   Acquired hypothyroidism 11/17/2015   Chronic kidney disease, stage III (moderate) (HCC) 11/17/2015   Depression, major, recurrent, moderate (HCC) 11/17/2015   Diabetes type 2, controlled (HCC) 11/17/2015   Essential hypertension 11/17/2015   Pure hypercholesterolemia 11/17/2015   Spinal stenosis of lumbar region 08/09/2013    Past Surgical History:  Procedure Laterality Date   ABDOMINAL HYSTERECTOMY N/A    for noncancerous reasons   APPENDECTOMY     COLONOSCOPY W/ BIOPSIES AND POLYPECTOMY     Hx; of   LUMBAR LAMINECTOMY N/A 08/09/2013   Procedure: Left L5-S1 resection of synovial cyst, decompression left L4-5 lateral recess;  Surgeon: Lynwood FORBES Better, MD;  Location: [facility] OR;   Service: Orthopedics;  Laterality: N/A;   SUBMANDIBULAR GLAND EXCISION Left 06/13/2015   Procedure: EXCISION SUBMANDIBULAR GLAND, excision stone;  Surgeon: Lonni FORBES Angle, MD;  Location: [facility];  Service: ENT;  Laterality: Left;   TONSILLECTOMY      OB History   No obstetric history on file.      Home Medications    Prior to Admission medications  Medication Sig Start Date End Date Taking? Authorizing Provider  acetaminophen  (TYLENOL ) 325 MG tablet Take 325 mg by mouth every 6 (six) hours as needed.     [provider]  Camphor 3.1 % CREA Apply topically.    [provider]  cholecalciferol (VITAMIN D3) 25 MCG (1000 UNIT) tablet Take 1,000 Units by mouth. 11/24/18   [provider]  Cyanocobalamin  2500 MCG TABS Take by mouth. 12/24/16   [provider]  enalapril  (VASOTEC ) 10 MG tablet Take 0.5 tablets (5 mg total) by mouth daily. 09/03/19   Monetta Redell PARAS, MD  Levothyroxine  Sodium 25 MCG CAPS Take 25 mcg by mouth daily before breakfast.    [provider]  Multiple Vitamins-Minerals (PRESERVISION AREDS 2 PO) Take 2 capsules by mouth daily.    [provider]  sertraline  (ZOLOFT ) 100 MG tablet Take 100 mg by mouth daily.    [provider]  TIROSINT  25 MCG CAPS Take 1 capsule (  25 mcg total) by mouth daily before breakfast on an empty stomach 03/22/24       Family History Family History  Problem Relation Age of Onset   Heart disease Mother    Hypertension Mother    Heart disease Father    Hypertension Father    Heart disease Brother    Sarcoidosis Brother     Social History Social History[1]   Allergies   Denosumab , Ezetimibe, Penicillins, Prednisone, Statins, Asa [aspirin], and Demerol [meperidine]   Review of Systems Review of Systems  Musculoskeletal:  Positive for back pain.     Physical Exam Triage Vital Signs ED Triage Vitals [10/04/24 1906]  Encounter Vitals Group     BP  (!) 164/81     Girls Systolic BP Percentile      Girls Diastolic BP Percentile      Boys Systolic BP Percentile      Boys Diastolic BP Percentile      Pulse Rate (!) 50     Resp 20     Temp 98 F (36.7 C)     Temp Source Temporal     SpO2 93 %     Weight      Height      Head Circumference      Peak Flow      Pain Score      Pain Loc      Pain Education      Exclude from Growth Chart    No data found.  Updated Vital Signs BP (!) 164/81 (BP Location: Right Arm)   Pulse (!) 50   Temp 98 F (36.7 C) (Temporal)   Resp 20   SpO2 93%   Visual Acuity Right Eye Distance:   Left Eye Distance:   Bilateral Distance:    Right Eye Near:   Left Eye Near:    Bilateral Near:     Physical Exam Vitals and nursing note reviewed.  Constitutional:      General: She is not in acute distress.    Appearance: Normal appearance. She is not ill-appearing, toxic-appearing or diaphoretic.  Pulmonary:     Effort: Pulmonary effort is normal.  Musculoskeletal:        General: Tenderness present.     Lumbar back: Tenderness and bony tenderness present. No swelling or deformity. Decreased range of motion.  Neurological:     Mental Status: She is alert.  Psychiatric:        Mood and Affect: Mood normal.      UC Treatments / Results  Labs (all labs ordered are listed, but only abnormal results are displayed) Labs Reviewed - No data to display  EKG   Radiology DG Lumbar Spine Complete Result Date: 10/04/2024 EXAM: 4 VIEW(S) XRAY OF THE LUMBAR SPINE 10/04/2024 07:45:00 PM COMPARISON: MRI from 12/04/2012. CLINICAL HISTORY: back pain after a fall FINDINGS: LUMBAR SPINE: BONES: Vertebral body heights are maintained. Anterolisthesis of L4 on L5 and L5 on S1 is noted. No pars defects are noted. DISCS AND DEGENERATIVE CHANGES: Facet hypertrophic changes are seen. SOFT TISSUES: No soft tissue abnormality is seen. IMPRESSION: 1. No acute osseous injury. 2. Anterolisthesis of L4 on L5 and L5 on  S1 with facet hypertrophic changes. No pars defects. Electronically signed by: Oneil Devonshire MD MD 10/04/2024 08:38 PM EST RP Workstation: [id number]    Procedures Procedures (including critical care time)  Medications Ordered in UC Medications - No data to display  Initial Impression / Assessment and Plan / UC  Course  I have reviewed the triage vital signs and the nursing notes.  Pertinent labs & imaging results that were available during my care of the patient were reviewed by me and considered in my medical decision making (see chart for details).     Acute midline lower back pain without sciatica-patient had a fall a few days prior unknown of how it happened or mechanism.  X-ray done today which revealed no acute fracture or abnormality.  Most likely muscle spasm, strain.  Recommendations were to continue Tylenol  as needed for pain.  She can do heat to the area for muscle relaxation.  Muscle rubs over-the-counter to the area. Follow-up with PCP as needed Final Clinical Impressions(s) / UC Diagnoses   Final diagnoses:  Acute midline low back pain without sciatica     Discharge Instructions      X-ray done today which revealed no acute fracture or abnormality.  Most likely muscle spasm, strain.  Recommendations were to continue Tylenol  as needed for pain.  She can do heat to the area for muscle relaxation.  Muscle rubs over-the-counter to the area. Follow-up with PCP as needed     ED Prescriptions   None    PDMP not reviewed this encounter.     [1]  Social History Tobacco Use   Smoking status: Never   Smokeless tobacco: Never  Vaping Use   Vaping status: Never Used  Substance Use Topics   Alcohol use: No   Drug use: Never     Karen Wilbert LABOR, FNP 10/05/24 1224  "

## 2024-10-04 NOTE — ED Triage Notes (Signed)
 Fall approx 3 days ago. Initially did not complain of any pain. Reports pain tonight. Family member unsure how fall occurred or how she fell whether forward or backward. No visible areas of discoloration to back. Patient lives alone. Patient is able to ambulate but does so very guarded. Unsure if due to pain or general apprehension.

## 2024-10-05 NOTE — Discharge Instructions (Signed)
 X-ray done today which revealed no acute fracture or abnormality.  Most likely muscle spasm, strain.  Recommendations were to continue Tylenol  as needed for pain.  She can do heat to the area for muscle relaxation.  Muscle rubs over-the-counter to the area. Follow-up with PCP as needed

## 2024-10-13 ENCOUNTER — Other Ambulatory Visit (HOSPITAL_BASED_OUTPATIENT_CLINIC_OR_DEPARTMENT_OTHER): Payer: Self-pay | Admitting: Family Medicine

## 2024-10-13 DIAGNOSIS — R627 Adult failure to thrive: Secondary | ICD-10-CM

## 2024-10-14 ENCOUNTER — Telehealth (HOSPITAL_BASED_OUTPATIENT_CLINIC_OR_DEPARTMENT_OTHER): Payer: Self-pay | Admitting: *Deleted

## 2024-10-14 ENCOUNTER — Encounter (HOSPITAL_BASED_OUTPATIENT_CLINIC_OR_DEPARTMENT_OTHER): Payer: Self-pay | Admitting: *Deleted

## 2024-10-15 ENCOUNTER — Telehealth (HOSPITAL_BASED_OUTPATIENT_CLINIC_OR_DEPARTMENT_OTHER): Payer: Self-pay | Admitting: *Deleted

## 2024-10-15 NOTE — Telephone Encounter (Signed)
 Pt. Son made aware.

## 2024-10-15 NOTE — Telephone Encounter (Signed)
 Son made aware through voicemail and MyChart.

## 2024-10-18 NOTE — Telephone Encounter (Signed)
 Pt. Son made aware of message.

## 2024-10-28 ENCOUNTER — Other Ambulatory Visit (HOSPITAL_BASED_OUTPATIENT_CLINIC_OR_DEPARTMENT_OTHER): Payer: Self-pay

## 2024-10-28 MED ORDER — LORAZEPAM 0.5 MG PO TABS
0.2500 mg | ORAL_TABLET | ORAL | 0 refills | Status: AC | PRN
  Filled 2024-10-28: qty 30, 10d supply, fill #0

## 2024-10-28 MED ORDER — MORPHINE SULFATE (CONCENTRATE) 10 MG /0.5 ML PO SOLN
5.0000 mg | ORAL | 0 refills | Status: AC | PRN
  Filled 2024-10-28: qty 30, 20d supply, fill #0

## 2024-10-29 ENCOUNTER — Ambulatory Visit (HOSPITAL_BASED_OUTPATIENT_CLINIC_OR_DEPARTMENT_OTHER)
